# Patient Record
Sex: Female | Born: 1975 | Race: White | Hispanic: No | Marital: Married | Smoking: Former smoker
Health system: Southern US, Community
[De-identification: ages and names within clinical notes are randomized; demographics above are authoritative.]

## PROBLEM LIST (undated history)

## (undated) ENCOUNTER — Inpatient Hospital Stay (HOSPITAL_COMMUNITY): Payer: Self-pay

## (undated) DIAGNOSIS — R51 Headache: Secondary | ICD-10-CM

## (undated) DIAGNOSIS — E119 Type 2 diabetes mellitus without complications: Secondary | ICD-10-CM

## (undated) HISTORY — PX: TONSILLECTOMY: SUR1361

---

## 2001-08-31 ENCOUNTER — Emergency Department (HOSPITAL_COMMUNITY): Admission: EM | Admit: 2001-08-31 | Discharge: 2001-08-31 | Payer: Self-pay | Admitting: Emergency Medicine

## 2001-09-04 ENCOUNTER — Emergency Department (HOSPITAL_COMMUNITY): Admission: EM | Admit: 2001-09-04 | Discharge: 2001-09-04 | Payer: Self-pay | Admitting: Emergency Medicine

## 2001-10-24 ENCOUNTER — Emergency Department (HOSPITAL_COMMUNITY): Admission: EM | Admit: 2001-10-24 | Discharge: 2001-10-24 | Payer: Self-pay | Admitting: Emergency Medicine

## 2002-04-28 ENCOUNTER — Emergency Department (HOSPITAL_COMMUNITY): Admission: EM | Admit: 2002-04-28 | Discharge: 2002-04-29 | Payer: Self-pay | Admitting: Emergency Medicine

## 2002-07-20 ENCOUNTER — Inpatient Hospital Stay (HOSPITAL_COMMUNITY): Admission: AD | Admit: 2002-07-20 | Discharge: 2002-07-20 | Payer: Self-pay | Admitting: *Deleted

## 2002-08-21 ENCOUNTER — Inpatient Hospital Stay (HOSPITAL_COMMUNITY): Admission: EM | Admit: 2002-08-21 | Discharge: 2002-08-22 | Payer: Self-pay | Admitting: Psychiatry

## 2002-12-18 ENCOUNTER — Emergency Department (HOSPITAL_COMMUNITY): Admission: EM | Admit: 2002-12-18 | Discharge: 2002-12-19 | Payer: Self-pay | Admitting: Emergency Medicine

## 2003-03-26 ENCOUNTER — Encounter: Payer: Self-pay | Admitting: Family Medicine

## 2003-03-26 ENCOUNTER — Ambulatory Visit (HOSPITAL_COMMUNITY): Admission: RE | Admit: 2003-03-26 | Discharge: 2003-03-26 | Payer: Self-pay | Admitting: Family Medicine

## 2003-04-29 ENCOUNTER — Emergency Department (HOSPITAL_COMMUNITY): Admission: EM | Admit: 2003-04-29 | Discharge: 2003-04-29 | Payer: Self-pay | Admitting: Emergency Medicine

## 2003-10-10 ENCOUNTER — Emergency Department (HOSPITAL_COMMUNITY): Admission: EM | Admit: 2003-10-10 | Discharge: 2003-10-10 | Payer: Self-pay | Admitting: Emergency Medicine

## 2003-11-04 ENCOUNTER — Emergency Department (HOSPITAL_COMMUNITY): Admission: AD | Admit: 2003-11-04 | Discharge: 2003-11-04 | Payer: Self-pay | Admitting: Family Medicine

## 2003-11-05 ENCOUNTER — Emergency Department (HOSPITAL_COMMUNITY): Admission: AD | Admit: 2003-11-05 | Discharge: 2003-11-05 | Payer: Self-pay | Admitting: Family Medicine

## 2003-12-29 ENCOUNTER — Emergency Department (HOSPITAL_COMMUNITY): Admission: EM | Admit: 2003-12-29 | Discharge: 2003-12-29 | Payer: Self-pay | Admitting: Family Medicine

## 2004-01-22 ENCOUNTER — Emergency Department (HOSPITAL_COMMUNITY): Admission: AD | Admit: 2004-01-22 | Discharge: 2004-01-22 | Payer: Self-pay | Admitting: Family Medicine

## 2004-06-23 ENCOUNTER — Emergency Department (HOSPITAL_COMMUNITY): Admission: EM | Admit: 2004-06-23 | Discharge: 2004-06-23 | Payer: Self-pay | Admitting: Emergency Medicine

## 2004-07-04 ENCOUNTER — Emergency Department (HOSPITAL_COMMUNITY): Admission: EM | Admit: 2004-07-04 | Discharge: 2004-07-04 | Payer: Self-pay | Admitting: Internal Medicine

## 2004-07-11 ENCOUNTER — Emergency Department (HOSPITAL_COMMUNITY): Admission: EM | Admit: 2004-07-11 | Discharge: 2004-07-11 | Payer: Self-pay | Admitting: Podiatry

## 2004-07-22 ENCOUNTER — Ambulatory Visit: Payer: Self-pay | Admitting: Nurse Practitioner

## 2004-08-18 ENCOUNTER — Ambulatory Visit: Payer: Self-pay | Admitting: Nurse Practitioner

## 2004-11-03 ENCOUNTER — Ambulatory Visit: Payer: Self-pay | Admitting: Nurse Practitioner

## 2004-12-31 ENCOUNTER — Emergency Department (HOSPITAL_COMMUNITY): Admission: EM | Admit: 2004-12-31 | Discharge: 2004-12-31 | Payer: Self-pay | Admitting: Emergency Medicine

## 2005-01-06 ENCOUNTER — Ambulatory Visit (HOSPITAL_COMMUNITY): Admission: RE | Admit: 2005-01-06 | Discharge: 2005-01-06 | Payer: Self-pay | Admitting: Pediatrics

## 2005-01-19 ENCOUNTER — Inpatient Hospital Stay (HOSPITAL_COMMUNITY): Admission: AD | Admit: 2005-01-19 | Discharge: 2005-01-19 | Payer: Self-pay | Admitting: *Deleted

## 2005-03-10 ENCOUNTER — Inpatient Hospital Stay (HOSPITAL_COMMUNITY): Admission: AD | Admit: 2005-03-10 | Discharge: 2005-03-10 | Payer: Self-pay | Admitting: Obstetrics and Gynecology

## 2005-03-14 ENCOUNTER — Inpatient Hospital Stay (HOSPITAL_COMMUNITY): Admission: AD | Admit: 2005-03-14 | Discharge: 2005-03-14 | Payer: Self-pay | Admitting: *Deleted

## 2005-03-15 ENCOUNTER — Inpatient Hospital Stay (HOSPITAL_COMMUNITY): Admission: AD | Admit: 2005-03-15 | Discharge: 2005-03-15 | Payer: Self-pay | Admitting: *Deleted

## 2005-03-16 ENCOUNTER — Inpatient Hospital Stay (HOSPITAL_COMMUNITY): Admission: AD | Admit: 2005-03-16 | Discharge: 2005-03-16 | Payer: Self-pay | Admitting: *Deleted

## 2005-04-27 ENCOUNTER — Ambulatory Visit (HOSPITAL_COMMUNITY): Admission: RE | Admit: 2005-04-27 | Discharge: 2005-04-27 | Payer: Self-pay | Admitting: *Deleted

## 2005-07-08 ENCOUNTER — Ambulatory Visit (HOSPITAL_COMMUNITY): Admission: RE | Admit: 2005-07-08 | Discharge: 2005-07-08 | Payer: Self-pay | Admitting: Family Medicine

## 2005-07-27 ENCOUNTER — Inpatient Hospital Stay (HOSPITAL_COMMUNITY): Admission: AD | Admit: 2005-07-27 | Discharge: 2005-07-27 | Payer: Self-pay | Admitting: Obstetrics & Gynecology

## 2005-07-30 ENCOUNTER — Inpatient Hospital Stay (HOSPITAL_COMMUNITY): Admission: AD | Admit: 2005-07-30 | Discharge: 2005-07-31 | Payer: Self-pay | Admitting: Obstetrics & Gynecology

## 2005-08-07 ENCOUNTER — Inpatient Hospital Stay (HOSPITAL_COMMUNITY): Admission: AD | Admit: 2005-08-07 | Discharge: 2005-08-07 | Payer: Self-pay | Admitting: *Deleted

## 2005-08-07 ENCOUNTER — Ambulatory Visit: Payer: Self-pay | Admitting: Certified Nurse Midwife

## 2005-08-16 ENCOUNTER — Ambulatory Visit (HOSPITAL_BASED_OUTPATIENT_CLINIC_OR_DEPARTMENT_OTHER): Admission: RE | Admit: 2005-08-16 | Discharge: 2005-08-16 | Payer: Self-pay | Admitting: *Deleted

## 2005-08-16 ENCOUNTER — Inpatient Hospital Stay (HOSPITAL_COMMUNITY): Admission: AD | Admit: 2005-08-16 | Discharge: 2005-08-16 | Payer: Self-pay | Admitting: Obstetrics and Gynecology

## 2005-08-17 ENCOUNTER — Ambulatory Visit (HOSPITAL_BASED_OUTPATIENT_CLINIC_OR_DEPARTMENT_OTHER): Admission: RE | Admit: 2005-08-17 | Discharge: 2005-08-17 | Payer: Self-pay | Admitting: *Deleted

## 2005-08-22 ENCOUNTER — Ambulatory Visit: Payer: Self-pay | Admitting: Internal Medicine

## 2005-08-25 ENCOUNTER — Ambulatory Visit: Payer: Self-pay | Admitting: Pulmonary Disease

## 2005-08-31 ENCOUNTER — Ambulatory Visit: Payer: Self-pay | Admitting: Certified Nurse Midwife

## 2005-08-31 ENCOUNTER — Inpatient Hospital Stay (HOSPITAL_COMMUNITY): Admission: AD | Admit: 2005-08-31 | Discharge: 2005-08-31 | Payer: Self-pay | Admitting: *Deleted

## 2005-09-03 ENCOUNTER — Ambulatory Visit: Payer: Self-pay | Admitting: *Deleted

## 2005-09-06 ENCOUNTER — Inpatient Hospital Stay (HOSPITAL_COMMUNITY): Admission: AD | Admit: 2005-09-06 | Discharge: 2005-09-10 | Payer: Self-pay | Admitting: Obstetrics & Gynecology

## 2005-09-06 ENCOUNTER — Encounter (INDEPENDENT_AMBULATORY_CARE_PROVIDER_SITE_OTHER): Payer: Self-pay | Admitting: *Deleted

## 2005-09-06 ENCOUNTER — Ambulatory Visit: Payer: Self-pay | Admitting: *Deleted

## 2005-09-16 ENCOUNTER — Ambulatory Visit: Payer: Self-pay | Admitting: Pulmonary Disease

## 2005-09-23 ENCOUNTER — Ambulatory Visit: Payer: Self-pay | Admitting: Family Medicine

## 2005-10-11 ENCOUNTER — Emergency Department (HOSPITAL_COMMUNITY): Admission: EM | Admit: 2005-10-11 | Discharge: 2005-10-11 | Payer: Self-pay | Admitting: Family Medicine

## 2005-10-27 ENCOUNTER — Emergency Department (HOSPITAL_COMMUNITY): Admission: EM | Admit: 2005-10-27 | Discharge: 2005-10-27 | Payer: Self-pay | Admitting: Emergency Medicine

## 2005-11-15 ENCOUNTER — Emergency Department (HOSPITAL_COMMUNITY): Admission: EM | Admit: 2005-11-15 | Discharge: 2005-11-15 | Payer: Self-pay | Admitting: Emergency Medicine

## 2005-12-13 ENCOUNTER — Ambulatory Visit: Payer: Self-pay | Admitting: Nurse Practitioner

## 2006-01-03 ENCOUNTER — Encounter: Admission: RE | Admit: 2006-01-03 | Discharge: 2006-01-03 | Payer: Self-pay | Admitting: Family Medicine

## 2006-01-18 ENCOUNTER — Ambulatory Visit: Payer: Self-pay | Admitting: Pulmonary Disease

## 2006-02-10 ENCOUNTER — Ambulatory Visit: Payer: Self-pay | Admitting: *Deleted

## 2006-02-10 ENCOUNTER — Ambulatory Visit: Payer: Self-pay | Admitting: Nurse Practitioner

## 2006-02-25 ENCOUNTER — Ambulatory Visit: Payer: Self-pay | Admitting: Nurse Practitioner

## 2006-05-12 ENCOUNTER — Ambulatory Visit: Payer: Self-pay | Admitting: Nurse Practitioner

## 2006-06-07 ENCOUNTER — Ambulatory Visit: Payer: Self-pay | Admitting: Nurse Practitioner

## 2006-08-03 ENCOUNTER — Ambulatory Visit: Payer: Self-pay | Admitting: Nurse Practitioner

## 2006-10-19 ENCOUNTER — Ambulatory Visit: Payer: Self-pay | Admitting: Pulmonary Disease

## 2007-01-30 ENCOUNTER — Inpatient Hospital Stay (HOSPITAL_COMMUNITY): Admission: AD | Admit: 2007-01-30 | Discharge: 2007-01-30 | Payer: Self-pay | Admitting: Obstetrics & Gynecology

## 2007-02-01 ENCOUNTER — Other Ambulatory Visit: Admission: RE | Admit: 2007-02-01 | Discharge: 2007-02-01 | Payer: Self-pay | Admitting: Obstetrics and Gynecology

## 2007-03-02 ENCOUNTER — Ambulatory Visit (HOSPITAL_COMMUNITY): Admission: RE | Admit: 2007-03-02 | Discharge: 2007-03-02 | Payer: Self-pay | Admitting: Obstetrics and Gynecology

## 2007-05-08 ENCOUNTER — Inpatient Hospital Stay (HOSPITAL_COMMUNITY): Admission: AD | Admit: 2007-05-08 | Discharge: 2007-05-08 | Payer: Self-pay | Admitting: Obstetrics and Gynecology

## 2007-06-15 ENCOUNTER — Ambulatory Visit: Payer: Self-pay | Admitting: Pulmonary Disease

## 2007-06-27 ENCOUNTER — Encounter: Payer: Self-pay | Admitting: Nurse Practitioner

## 2007-06-27 DIAGNOSIS — Z87898 Personal history of other specified conditions: Secondary | ICD-10-CM | POA: Insufficient documentation

## 2007-06-27 DIAGNOSIS — F410 Panic disorder [episodic paroxysmal anxiety] without agoraphobia: Secondary | ICD-10-CM | POA: Insufficient documentation

## 2007-07-08 ENCOUNTER — Inpatient Hospital Stay (HOSPITAL_COMMUNITY): Admission: AD | Admit: 2007-07-08 | Discharge: 2007-07-08 | Payer: Self-pay | Admitting: Obstetrics and Gynecology

## 2007-07-19 ENCOUNTER — Encounter (INDEPENDENT_AMBULATORY_CARE_PROVIDER_SITE_OTHER): Payer: Self-pay | Admitting: Obstetrics and Gynecology

## 2007-07-19 ENCOUNTER — Inpatient Hospital Stay (HOSPITAL_COMMUNITY): Admission: RE | Admit: 2007-07-19 | Discharge: 2007-07-22 | Payer: Self-pay | Admitting: Obstetrics and Gynecology

## 2007-08-31 ENCOUNTER — Other Ambulatory Visit: Admission: RE | Admit: 2007-08-31 | Discharge: 2007-08-31 | Payer: Self-pay | Admitting: Obstetrics and Gynecology

## 2007-10-10 ENCOUNTER — Ambulatory Visit: Payer: Self-pay | Admitting: Internal Medicine

## 2008-03-15 ENCOUNTER — Ambulatory Visit: Payer: Self-pay | Admitting: Internal Medicine

## 2008-08-08 ENCOUNTER — Ambulatory Visit: Payer: Self-pay | Admitting: Internal Medicine

## 2008-08-08 LAB — CONVERTED CEMR LAB
BUN: 20 mg/dL (ref 6–23)
CO2: 23 meq/L (ref 19–32)
Calcium: 9.4 mg/dL (ref 8.4–10.5)
Chloride: 109 meq/L (ref 96–112)
Cholesterol: 164 mg/dL (ref 0–200)
Creatinine, Ser: 0.83 mg/dL (ref 0.40–1.20)
Glucose, Bld: 92 mg/dL (ref 70–99)
HDL: 58 mg/dL (ref >39)
LDL Cholesterol: 89 mg/dL (ref 0–99)
Potassium: 4.2 meq/L (ref 3.5–5.3)
Preg, Serum: NEGATIVE
Sodium: 142 meq/L (ref 135–145)
TSH: 0.654 microintl units/mL (ref 0.350–4.50)
Total CHOL/HDL Ratio: 2.8
Triglycerides: 84 mg/dL (ref <150)
VLDL: 17 mg/dL (ref 0–40)

## 2008-09-18 ENCOUNTER — Emergency Department (HOSPITAL_COMMUNITY): Admission: EM | Admit: 2008-09-18 | Discharge: 2008-09-18 | Payer: Self-pay | Admitting: Emergency Medicine

## 2008-10-06 ENCOUNTER — Emergency Department (HOSPITAL_COMMUNITY): Admission: EM | Admit: 2008-10-06 | Discharge: 2008-10-06 | Payer: Self-pay | Admitting: Emergency Medicine

## 2009-02-05 ENCOUNTER — Emergency Department (HOSPITAL_COMMUNITY): Admission: EM | Admit: 2009-02-05 | Discharge: 2009-02-05 | Payer: Self-pay | Admitting: Emergency Medicine

## 2009-02-19 ENCOUNTER — Ambulatory Visit: Payer: Self-pay | Admitting: Internal Medicine

## 2009-08-10 ENCOUNTER — Emergency Department (HOSPITAL_COMMUNITY): Admission: EM | Admit: 2009-08-10 | Discharge: 2009-08-10 | Payer: Self-pay | Admitting: Emergency Medicine

## 2010-01-06 ENCOUNTER — Emergency Department (HOSPITAL_COMMUNITY): Admission: EM | Admit: 2010-01-06 | Discharge: 2010-01-06 | Payer: Self-pay | Admitting: Family Medicine

## 2010-02-06 ENCOUNTER — Ambulatory Visit: Payer: Self-pay | Admitting: Internal Medicine

## 2010-08-10 ENCOUNTER — Emergency Department (HOSPITAL_COMMUNITY): Admission: EM | Admit: 2010-08-10 | Discharge: 2010-08-10 | Payer: Self-pay | Admitting: Emergency Medicine

## 2010-11-29 ENCOUNTER — Encounter: Payer: Self-pay | Admitting: Family Medicine

## 2010-11-29 ENCOUNTER — Encounter: Payer: Self-pay | Admitting: Obstetrics and Gynecology

## 2011-02-11 LAB — URINALYSIS, ROUTINE W REFLEX MICROSCOPIC
Bilirubin Urine: NEGATIVE
Glucose, UA: NEGATIVE mg/dL
Ketones, ur: NEGATIVE mg/dL
Leukocytes, UA: NEGATIVE
Nitrite: NEGATIVE
Protein, ur: 30 mg/dL — AB
Specific Gravity, Urine: 1.016 (ref 1.005–1.030)
Urobilinogen, UA: 0.2 mg/dL (ref 0.0–1.0)
pH: 5 (ref 5.0–8.0)

## 2011-02-11 LAB — BASIC METABOLIC PANEL
BUN: 7 mg/dL (ref 6–23)
CO2: 26 mEq/L (ref 19–32)
Calcium: 9.6 mg/dL (ref 8.4–10.5)
Chloride: 105 mEq/L (ref 96–112)
Creatinine, Ser: 0.58 mg/dL (ref 0.4–1.2)
GFR calc Af Amer: >60 mL/min (ref >60)
GFR calc non Af Amer: >60 mL/min (ref >60)
Glucose, Bld: 106 mg/dL — ABNORMAL HIGH (ref 70–99)
Potassium: 3.5 mEq/L (ref 3.5–5.1)
Sodium: 139 mEq/L (ref 135–145)

## 2011-02-11 LAB — URINE MICROSCOPIC-ADD ON

## 2011-02-11 LAB — PREGNANCY, URINE: Preg Test, Ur: NEGATIVE

## 2011-03-23 NOTE — Op Note (Signed)
[name], [name]               ACCOUNT NO.:  [id number]   MEDICAL RECORD NO.:  [id number]          PATIENT TYPE:  INP   LOCATION:  9110                          FACILITY:  [facility]   PHYSICIAN:  Rudy Jew. Ashley Royalty, M.D.DATE OF BIRTH:  Jul 27, 1976   DATE OF PROCEDURE:  07/19/2007  DATE OF DISCHARGE:                               OPERATIVE REPORT   PREOPERATIVE DIAGNOSES:  1. Intrauterine pregnancy at [redacted] weeks gestation.  2. Previous cesarean section.   POSTOPERATIVE DIAGNOSES:  1. Intrauterine pregnancy at [redacted] weeks gestation.  2. Previous cesarean section.   PROCEDURE:  Repeat low transverse cesarean section.   SURGEON:  Sylvester Harder, M.D.   ANESTHESIA:  Spinal.   FINDINGS:  A 7 pound 2 ounce female, Apgar's 8 at 1 minute and 9 at 5  minutes sent to newborn nursery.   ESTIMATED BLOOD LOSS:  600 cc.   COMPLICATIONS:  None.   PACKS AND DRAINS:  Foley.   SPONGE/NEEDLE/INSTRUMENT COUNT:  Reported as correct x2.   PROCEDURE:  The patient was taken to the operating room and placed in  the sitting position.  After a spinal anesthetic was administered, she  was placed in the dorsal supine position, prepped and draped in the  usual manner for abdominal surgery.  Foley catheter was placed.  The  Pfannenstiel incision was made through the patient's old skin incision.  The subcutaneous tissues were sharply dissected down to the fascia which  was nicked with a knife and incised transversely with Mayo scissors.  The underlying rectus muscles were separated from the fascia using sharp  and blunt dissection.  The rectus muscles were separated in the midline  exposing the peritoneum which was opened with hemostats and entered  atraumatically with Metzenbaum scissors.  The incision was extended  longitudinally.  Uterus was identified and a bladder flap created by  incising intrauterine serosa and sharply and bluntly dissecting the  bladder inferiorly.  It was held in place with a bladder  blade.  The  uterus was then entered through a low transverse incision using sharp  and blunt dissection.  The fluid was noted to be clear.  The infant was  delivered from the vertex presentation with the aid of the vacuum  extractor.  The infant was suctioned.  Cord was doubly clamped, cut and  the infant given immediately to the awaiting pediatrics team with Dr.  Ruben Gottron in attendance.  Cord blood was obtained and placenta and  membranes removed in their entirety.  The uterus was then closed in two  running layers with #1 Vicryl.  The first was a running locking layer.  The second was a running, intermittently locking, and imbricating layer.  One additional figure-of-eight suture was required to obtain hemostasis.  Hemostasis was noted.  Uterus, tubes and ovaries were inspected and  found to be normal.  Copious irrigation was accomplished and hemostasis  noted.  The peritoneum was then closed with 3-0 Vicryl in a running  fashion.  The fascia was closed with 0 Vicryl in a running fashion.  The  skin was closed with staples.  The patient tolerated procedure extremely well and was returned to the  recovery room in good condition.  At the conclusion of the procedure,  the urine was clear and copious.      James A. Ashley Royalty, M.D.  Electronically Signed     JAM/MEDQ  D:  07/19/2007  T:  07/20/2007  Job:  [id number]

## 2011-03-26 NOTE — Discharge Summary (Signed)
[name], [name]                         ACCOUNT NO.:  [id number]   MEDICAL RECORD NO.:  [id number]                   PATIENT TYPE:  IPS   LOCATION:  0504                                 [facility]ILITY[facility]   PHYSICIAN:  Jeanice Lim, M.D.              DATE OF BIRTH:  1976-02-28   DATE OF ADMISSION:  08/21/2002  DATE OF DISCHARGE:  08/22/2002                                 DISCHARGE SUMMARY   IDENTIFYING DATA:  This is a 35 year old Caucasian female, married,  voluntarily admitted, with a history of worsening depression, uncontrolled  crying, fear that she might commit suicide or hurt herself again.  Has been  irritable with children and unable to sleep and chronic conflict with  husband.   ADMISSION MEDICATIONS:  Lexapro, Ambien, Xanax.   ALLERGIES:  TORADOL.   PHYSICAL EXAMINATION:  Essentially within normal limits, neurologically  nonfocal.   ROUTINE ADMISSION LABS:  Essentially within normal limits.  CBC and CMET  within normal limits including liver function tests and thyroid panel.  TSH  was 1.164.  Urine tox screen was positive for benzodiazapines, positive for  barbiturates, positive for opiates, and otherwise negative.   MENTAL STATUS EXAM:  Well-nourished, well-developed female, fully alert,  blunted affect, somewhat withdrawn.  Speech within normal limits.  Mood  depressed, thought process goal directed, thought content negative for  psychotic symptoms, positive for vague suicidal ideation with thoughts of  cutting herself.  Judgment and insight poor.   ADMISSION DIAGNOSES:   AXIS I:  1. Major depression, recurrent, severe.  2. Caffeine abuse.   AXIS II:  None.   AXIS III:  History of migraines and episodic pain.   AXIS IV:  Moderate, marital conflict.   AXIS V:  38/54.   HOSPITAL COURSE:  The patient was admitted and ordered routine p.r.n.  medications, underwent further monitoring, and was encouraged to participate  in individual, group and  milieu therapy.  The patient was treated with  Fioricet regarding headaches and restarted on Lexapro, trazodone and Xanax  q.6 p.r.n.  Family meeting was held and patient was given Zomig for  continued headache which was ineffective, and the patient was then given  Demerol and Phenergan with a positive response.  The patient reported  significant improvement with clinical intervention.   CONDITION ON DISCHARGE:  Markedly improved.  Mood was more euthymic, affect  brighter, thought process goal directed.  Thought content negative for  dangerous ideation or psychotic symptoms.  The patient reported motivation  to be compliant with followup plan.   DISCHARGE MEDICATIONS:  1. Lexapro 10 mg 2 q.a.m.  2. Trazodone 50 mg 2 q.h.s. p.r.n. insomnia.  3. Ambien 10 mg p.o. q.h.s. p.r.n. insomnia.  4. Xanax 0.25 mg q.6 p.r.n. anxiety.   DISPOSITION:  The patient was to follow up with Dr. Lourdes Sledge on October 23  at 11 a.m. and with the Intensive outpatient program.  Jeanice Lim, M.D.    JEM/MEDQ  D:  09/19/2002  T:  09/19/2002  Job:  [id number]

## 2011-03-26 NOTE — Discharge Summary (Signed)
[name], [name]               ACCOUNT NO.:  [id number]   MEDICAL RECORD NO.:  [id number]          PATIENT TYPE:  INP   LOCATION:  9110                          [facility]ILITY[facility]   PHYSICIAN:  Rudy Jew. Ashley Royalty, M.D.DATE OF BIRTH:  Jun 19, 1976   DATE OF ADMISSION:  07/19/2007  DATE OF DISCHARGE:  07/22/2007                               DISCHARGE SUMMARY   DISCHARGE DIAGNOSES:  1. Intrauterine pregnancy at [redacted] weeks gestation, delivered.  2. Previous cesarean section.  3. Cervical intraepithelial neoplasia, grade 3.  4. Rh negative.   OPERATIONS AND SPECIAL PROCEDURES:  Repeat low transverse cesarean  section.   CONSULTATIONS:  None.   DISCHARGE MEDICATIONS:  Percocet, Motrin 600 mg.   HISTORY AND PHYSICAL:  This is 30-year gravida 3, para 2, at 31 weeks'  gestation.  Prenatal care was complicated by the aforementioned  diagnoses.  The patient did receive RhoGAM at 28 weeks' gestation.  She  was admitted for a repeat cesarean section.  For the remainder of the  history and physical, please see chart.   HOSPITAL COURSE:  The patient was admitted to [facility].  Admission laboratory studies were drawn.  On July 19, 2007, she was taken to the operating room and underwent a repeat low  transverse cesarean section per Dr. Sylvester Harder.  The procedure  yielded a 7 pound 2 ounce female, Apgars 8 at one minute and 9 at five  minutes, sent to the newborn nursery.  There were no complications.  The  patient was then observed postoperatively.  She was felt to be stable  for discharge on July 22, 2007.  She was discharged home afebrile  and in satisfactory condition on that date.   DISPOSITION:  The patient is to return to [facility] in approximately 6 weeks for postpartum evaluation.      James A. Ashley Royalty, M.D.  Electronically Signed     JAM/MEDQ  D:  09/12/2007  T:  09/13/2007  Job:  [id number]

## 2011-03-26 NOTE — Procedures (Signed)
NAMEJAYLINE, [name]               ACCOUNT NO.:  [id number]   MEDICAL RECORD NO.:  [id number]          PATIENT TYPE:  OUT   LOCATION:  SLEEP CENTER                 FACILITY:  [facility]   PHYSICIAN:  Clinton D. Maple Hudson, M.D. DATE OF BIRTH:  06-Apr-1976   DATE OF STUDY:  08/17/2005                              NOCTURNAL POLYSOMNOGRAM   REFERRING PHYSICIAN:  Conni Elliot, M.D.   DATE OF STUDY:  August 17, 2005   INDICATION FOR STUDY:  Hypersomnia with sleep apnea.  The patient is [redacted]  weeks pregnant.  There is prior history of seizure disorder.   SLEEP ARCHITECTURE:  Total sleep time 238 minutes.  Sleep efficiency 58%.  Stage I was 12%, Stage II 85%, Stages III and IV were absent.  REM 3% of  total sleep. Sleep latency 5 minutes, REM latency 69 minutes.  Awake after  sleep onset 165 minutes.  Arousal index increased at 112.  She had taken 10  mg Ambien at 10:26 p.m.   RESPIRATORY DATA:  Respiratory disturbance index (AHI, RDI) 116 obstructive  events per hour, indicating very severe obstructive sleep apnea/hypopnea  syndrome.  There were 1 central apnea, 386 obstructive apneas, and 76  hypopneas. Events were not positional. REM AHI 30 per hour. She was unable  to sustain sleep sufficiently for CPAP titration by split protocol on this  study night with time awake between midnight and 1:30 a.m. and again 3:30  through 4:30 a.m.   OXYGEN DATA:  Loud snoring with oxygen desaturation to a nadir of 79%.  Mean  oxygen saturation through the study was 93% on room air.   CARDIAC DATA:  Sinus rhythm with PACs, sinus arrhythmia, and sinus  tachycardia with peak heart rate recorded 149 and lowest heart rate 42 beats  per minute.   MOVEMENT-PARASOMNIA:  Occasional leg jerk, insignificant.  Bathroom x3. She  had given a history of previous seizure. Additional EEG leads were applied,  but seizure activity was not noted on this study.   IMPRESSIONS-RECOMMENDATIONS:  1.  Very severe obstructive  sleep apnea/hypopnea syndrome, AHI 116 per      minute with loud snoring and oxygen desaturation to a nadir of 79%.  2.  Difficulty initiating and maintaining sleep despite Ambien 10 mg at      bedtime.  3.  Consider early return for CPAP titration or evaluate for alternative      therapies as appropriate. Some degree of improvement can be anticipated      after delivery.      Clinton D. Maple Hudson, M.D.  Diplomate, Biomedical engineer of Sleep Medicine  Electronically Signed     CDY/MEDQ  D:  08/18/2005 14:25:22  T:  08/18/2005 15:26:39  Job:  [id number]

## 2011-03-26 NOTE — Op Note (Signed)
[name], [name]               ACCOUNT NO.:  [id number]   MEDICAL RECORD NO.:  [id number]          PATIENT TYPE:  INP   LOCATION:  9111                          FACILITY:  [facility]   PHYSICIAN:  Conni Elliot, M.D.DATE OF BIRTH:  1976-07-31   DATE OF PROCEDURE:  09/07/2005  DATE OF DISCHARGE:                                 OPERATIVE REPORT   PREOPERATIVE DIAGNOSES:  1.  Forty-one week intrauterine pregnancy.  2.  Persistent bradycardia.   POSTOPERATIVE DIAGNOSES:  1.  Forty-one week intrauterine pregnancy.  2.  Persistent bradycardia.   PROCEDURE:  Primary low transverse cesarean section via Pfannenstiel.   SURGEON:  Conni Elliot, M.D.   ASSISTANT:  Marc Morgans. Mayford Knife, M.D.   ANESTHESIA:  Epidural.   COMPLICATIONS:  None.   ESTIMATED BLOOD LOSS:  800 mL.   URINE OUTPUT:  100 mL clear urine.   FLUIDS:  2200 mL lactated Ringer's.   INDICATIONS:  The patient is a 35 year old G2, P1, at 40-6/7, who was  admitted last night for Cervidil induction.  The patient progressed to 8 cm  by an R.N. exam.  She did have one brief episode of bradycardia.  The  Cervidil was pulled and she got an IV fluid bolus, oxygen and was rolled.  She responded well to that.  She then had artificial rupture of membranes  plus a fetal scalp electrode.  There was clear fluid.  The patient went on  to develop persistent bradycardia that did not respond to resuscitation,  including IV fluid bolus, rolling her, oxygen and terbutaline.  The patient  was taken to the back.  Please note that while this was going on, the  patient had had an epidural approximately an hour and a half earlier and her  blood pressure was 110/56 while the baby was bradycardic.  In the OR, Dr.  Penne Lash examined the patient and found her to still be 7 cm and a decision  was made to proceed with a cesarean section.   FINDINGS:  A female infant in the occiput posterior cephalic presentation.  Apgars 8 and 9.  Weight 8 pounds  4 ounces.  Normal uterus, tubes, and  ovaries.  Blood gas was 7.22.   PROCEDURE:  The patient was taken to the operating room, where anesthesia  was found to be adequate.  She was then prepped and draped in the normal  sterile fashion in the dorsal supine position with a leftward tilt.  A  Pfannenstiel skin incision was then made with a scalpel and carried through  to the underlying layer of fascia.  The fascia was nicked in the midline and  the incision extended bilaterally with the Mayo scissors.  The superior  aspect of the fascial incision was then grasped with Kocher clamps, elevated  and the underlying rectus muscles dissected off bluntly and using Mayos.  Attention was then turned to the inferior aspect of the incision, which in a  similar fashion was grasped, tented up with Kocher clamps, and the rectus  muscles dissected off bluntly.  The rectus muscles were then separated in  the midline and peritoneum identified, tented up and entered sharply with  the Metzenbaum scissors.  The peritoneal incision was then extended  superiorly and inferiorly with good visualization of the bladder.   The bladder blade was then inserted and the vesicouterine peritoneum  identified, grasped with the pick-ups, and entered sharply with the  Metzenbaum scissors.  The incision was extended laterally and a bladder flap  created digitally.   The bladder blade was then reinserted and the lower uterine segment incised  in a transverse fashion with a scalpel.  The uterine incision was then  extended laterally manually.  The bladder blade was removed and the infant's  head and body delivered atraumatically.  The nose and mouth were bulb-  suctioned and the cord clamped and cut.  The infant was handed off to the  awaiting neonatologist.  Cord gases were sent.  The placenta was then  removed by massaging the outside of the uterus.  The uterus was exteriorized  and cleared of all clots and debris.  The  uterine incision was then repaired  with 1-0 chromic in a running locked fashion and a second layer of the same  suture was used to imbricate.  There was excellent hemostasis.  The uterus  was then replaced and the gutter was irrigated.   The bladder flap was repaired with 2-0 chromic on an SH in a running stitch.  Gutters were cleared of all clots and the peritoneum closed with 2-0  chromic.  The fascia was reapproximated with 0 Vicryl in a running fashion.  The subcutaneous tissue was closed with plain gut.  The skin was closed with  staples.   The patient tolerated the procedure well.  Sponge, lap and instrument counts  were correct x2.  Ancef 1 g was given at cord clamp.  The patient was taken  to the recovery room in stable condition, and there were no complications.     ______________________________  Marc Morgans Mayford Knife, M.D.    ______________________________  Conni Elliot, M.D.    TLW/MEDQ  D:  09/07/2005  T:  09/07/2005  Job:  [id number]

## 2011-03-26 NOTE — Procedures (Signed)
NAMECHARLIZE, [name]               ACCOUNT NO.:  [id number]   MEDICAL RECORD NO.:  [id number]          PATIENT TYPE:  OUT   LOCATION:  SLEEP CENTER                 FACILITY:  [facility]   PHYSICIAN:  Clinton D. Maple Hudson, M.D. DATE OF BIRTH:  February 05, 1976   DATE OF STUDY:  08/17/2005                              NOCTURNAL POLYSOMNOGRAM   INDICATION FOR STUDY:  Hypersomnia with sleep apnea.   EPWORTH SLEEPINESS SCORE:  12/24; BMI 29; weight 154 pounds. A baseline  diagnostic NPSG was done August 16, 2006 recording an AHI of 116 per hour.  CPAP titration is now requested. The patient is [redacted] weeks pregnant.   SLEEP ARCHITECTURE:  Short total sleep time 217 minutes with sleep  efficiency 56%. Stage 1 was 8%, stage 2 62%, stages 3 and 4 23%, REM 75 of  total sleep time. Sleep latency 13 minutes, REM latency 193 minutes, awake  after sleep onset 157 minutes, arousal index 58. The patient had taken  Ambien at bedtime.   RESPIRATORY DATA:  1.  CPAP titration protocol. CPAP was titrated to 15 CWP, AHI 0 per hour. A      large Respironics Comfort Lite 2 nasal mask was used with chin strap and      heated humidifier.  2.  Baseline diagnostic NPSG on August 16, 2005 had recorded an AHI of 116      obstructive events per hour.  3.  The patient had difficulty maintaining sleep near the end of the study      due to hip pain and back pain attributed to pregnancy.   OXYGEN DATA:  Therapeutic CPAP levels prevented audible snoring and  maintained oxygen saturation 95-98% on room air.   CARDIAC DATA:  Sinus rhythm with frequent PACs.   MOVEMENT-PARASOMNIA:  Occasional leg jerk with little effect on sleep.  Bathroom x2.   IMPRESSIONS-RECOMMENDATIONS:  1.  Successful CPAP titration to 15 CWP, AHI 0 per hour. A large Respironics      Comfort Lite 2 nasal mask was used with heated humidifier. The      technician also provided a chin strip for this study, unlikely to be      needed at home.  2.  Baseline  diagnostic NPSG on August 16, 2005 had recorded an AHI of 116      per hour.  3.  This information is being communicated to the office of Dr. Gavin Potters to      facilitate early arrangement for home CPAP. Alternative therapies and      adjustments can be provided on follow-up if needed.      Clinton D. Maple Hudson, M.D.  Diplomate, Biomedical engineer of Sleep Medicine  Electronically Signed     CDY/MEDQ  D:  08/18/2005 14:40:44  T:  08/18/2005 15:41:36  Job:  [id number]

## 2011-03-26 NOTE — Procedures (Signed)
DATE:  01/06/2005   REFERRED BY:  Noberto Retort, M.D. and Deanna Artis. Sharene Skeans, M.D.   CLINICAL COURSE:  A 35 year old lady being evaluated for seizures.   MEDICATIONS:  Not listed.   This 17 channel EEG performed in wakeful and drowsy state using standard  10/20 electrode placement.   Background awake rhythm consists of 11-12 Hz alpha which is of good  amplitude, well modulate, synchronous and reactive to eye opening and  closure.  No paroxysmal epileptiform activities, spikes or sharp waves are  seen. Sleep state is not achieved in this tracing except for mild drowsiness  which is uneventful.  Length of the tracing is 24.7 minutes, technical  component is excellent. EKG tracing reveals regular sinus rhythm.  Hyperventilation results no significant abnormalities, photic stimulation is  unremarkable.   IMPRESSION:  Procedure performed during wakeful state and drowsiness is  within normal limits. No definite epileptiform activity was identified. If  seizures are strongly suspected, __________ sleep deprived may be  beneficial.      ZOX:WRUE  D:  01/06/2005 18:50:17  T:  01/07/2005 07:55:31  Job #:  [id number]   cc:   Melida Quitter, M.D.  [address]  [place]  [place] [address]  Fax: [phone]

## 2011-03-26 NOTE — Discharge Summary (Signed)
[name], [name]               ACCOUNT NO.:  [id number]   MEDICAL RECORD NO.:  [id number]          PATIENT TYPE:  INP   LOCATION:  9111                          [facility]ILITY[facility]   PHYSICIAN:  Tracy L. Mayford Knife, M.D.DATE OF BIRTH:  04-Jun-1976   DATE OF ADMISSION:  09/06/2005  DATE OF DISCHARGE:  09/10/2005                                 DISCHARGE SUMMARY   REASON FOR ADMISSION:  Cervidil induction of 40 weeks 6/7-day pregnancy.   DISCHARGE DIAGNOSES:  Status post primary low transverse cesarean section  via Pfannenstiel of 41-week intrauterine pregnancy for persistent  bradycardia.   HOSPITAL COURSE:  The patient is a 35 year old G2, P1 at 40-6/7 who was  admitted for Cervidil induction.  The patient progressed to 8 cm by R.N.  examination the following morning.  She had one brief episode of  bradycardia.  The Cervidil was pulled and she had an IV fluid bolus.  Oxygen  was begun.  She responded well to that.  She then had artificial rupture of  membranes plus a fetal scalp electrode.  There was clear fluid.  The patient  went on to develop a persistent bradycardia that did not respond to  resuscitation which included IV fluid bolus, oxygen and terbutaline.  The  patient was taken to the back.  Please note that while this was going on the  patient had had an epidural approximately an hour and a half earlier to the  persistent decel and at the time of the decel her blood pressure was 110/56.  In the OR Dr. Penne Lash examined the patient, found her still to be 7 cm and  the decision was made to proceed with a cesarean section.  Please see op  note for full details.  The female infant was found to be in the occiput  posterior cephalic presentation.  Apgars were 8 and 9.  Weight was 8 pounds  4 ounces.  Blood gas was 7.22.  Placenta was sent and the final diagnosis  was a mature placenta with three vessel umbilical cord and no significant  inflammation.  Patient tolerated the procedure well  and there were no  complications.  Patient did well postoperatively and was discharged on  postpartum day three.  She was found to be a little bit tachycardic so a TSH  and free T4 was done which were within normal limits.   DISPOSITION:  Home.   FOLLOW-UP:  Two and six weeks.  Regular activity.  No heavy lifting.  Nothing per vagina x6 weeks.   MEDICATIONS:  1.  Depo Provera.  2.  Percocet 5 one to two p.o. q.4-6h.  3.  Ibuprofen 600 mg p.o. q.6h. p.r.n.  4.  Prenatal vitamins one tablet daily.           ______________________________  Marc Morgans Mayford Knife, M.D.     TLW/MEDQ  D:  10/05/2005  T:  10/05/2005  Job:  [id number]

## 2011-08-10 LAB — URINALYSIS, ROUTINE W REFLEX MICROSCOPIC
Glucose, UA: NEGATIVE
Hgb urine dipstick: NEGATIVE
Leukocytes, UA: NEGATIVE
pH: 5.5

## 2011-08-10 LAB — COMPREHENSIVE METABOLIC PANEL
ALT: 24
AST: 29
CO2: 24
Calcium: 8.6
Creatinine, Ser: 0.7
GFR calc Af Amer: >60
GFR calc non Af Amer: >60
Glucose, Bld: 97
Sodium: 137
Total Protein: 5.9 — ABNORMAL LOW

## 2011-08-10 LAB — DIFFERENTIAL
Lymphocytes Relative: 20
Lymphs Abs: 1.1
Monocytes Relative: 4
Neutrophils Relative %: 74

## 2011-08-10 LAB — URINE MICROSCOPIC-ADD ON

## 2011-08-10 LAB — CBC
MCHC: 34.1
MCV: 91.9
RBC: 3.7 — ABNORMAL LOW
RDW: 13.2

## 2011-08-20 LAB — CBC
HCT: 32 — ABNORMAL LOW
MCHC: 35.1
MCV: 88.2
Platelets: 214
Platelets: 243
RDW: 12.9
RDW: 13.1
WBC: 12.2 — ABNORMAL HIGH

## 2011-08-20 LAB — RPR: RPR Ser Ql: NONREACTIVE

## 2011-08-20 LAB — RH IMMUNE GLOB WKUP(>/=20WKS)(NOT WOMEN'S HOSP)

## 2011-08-25 LAB — RH IMMUNE GLOBULIN WORKUP (NOT WOMEN'S HOSP): ABO/RH(D): A NEG

## 2013-01-04 ENCOUNTER — Inpatient Hospital Stay (HOSPITAL_COMMUNITY)
Admission: AD | Admit: 2013-01-04 | Discharge: 2013-01-05 | Disposition: A | Discharge status: Home / Self Care | Payer: Self-pay | Source: Ambulatory Visit | Attending: Obstetrics & Gynecology | Admitting: Obstetrics & Gynecology

## 2013-01-04 ENCOUNTER — Encounter (HOSPITAL_COMMUNITY): Payer: Self-pay | Admitting: *Deleted

## 2013-01-04 DIAGNOSIS — O210 Mild hyperemesis gravidarum: Secondary | ICD-10-CM | POA: Insufficient documentation

## 2013-01-04 DIAGNOSIS — R109 Unspecified abdominal pain: Secondary | ICD-10-CM | POA: Insufficient documentation

## 2013-01-04 DIAGNOSIS — O0930 Supervision of pregnancy with insufficient antenatal care, unspecified trimester: Secondary | ICD-10-CM | POA: Insufficient documentation

## 2013-01-04 DIAGNOSIS — O0932 Supervision of pregnancy with insufficient antenatal care, second trimester: Secondary | ICD-10-CM

## 2013-01-04 DIAGNOSIS — O219 Vomiting of pregnancy, unspecified: Secondary | ICD-10-CM

## 2013-01-04 DIAGNOSIS — R002 Palpitations: Secondary | ICD-10-CM

## 2013-01-04 DIAGNOSIS — G43909 Migraine, unspecified, not intractable, without status migrainosus: Secondary | ICD-10-CM | POA: Insufficient documentation

## 2013-01-04 DIAGNOSIS — N949 Unspecified condition associated with female genital organs and menstrual cycle: Secondary | ICD-10-CM

## 2013-01-04 DIAGNOSIS — O99891 Other specified diseases and conditions complicating pregnancy: Secondary | ICD-10-CM | POA: Insufficient documentation

## 2013-01-04 LAB — URINALYSIS, ROUTINE W REFLEX MICROSCOPIC
Bilirubin Urine: NEGATIVE
Hgb urine dipstick: NEGATIVE
Nitrite: NEGATIVE
Protein, ur: NEGATIVE mg/dL
Urobilinogen, UA: 0.2 mg/dL (ref 0.0–1.0)

## 2013-01-04 NOTE — MAU Note (Signed)
PT SAYS  WHEN SHE WAKES HER HEART IS POUNDING.    THIS ALL BEGAN  2 WEEKS AGO.  TONIGHT IT BECAME PAINFUL-    IT GOES AWAY IN 30 MIN.  DOES NOT FEEL NOW LIKE IT DID BEFORE.    NO PNC.  SHE WENT TO [facility]  IN [place]- DID UPT.     SHE IS WAITING ON MEDICAID.      DR MATTHEWS DELIVERED  OTHER BABIES.

## 2013-01-05 ENCOUNTER — Encounter (HOSPITAL_COMMUNITY): Payer: Self-pay | Admitting: Advanced Practice Midwife

## 2013-01-05 DIAGNOSIS — N949 Unspecified condition associated with female genital organs and menstrual cycle: Secondary | ICD-10-CM

## 2013-01-05 LAB — CBC
MCH: 30.8 pg (ref 26.0–34.0)
MCV: 87.3 fL (ref 78.0–100.0)
Platelets: 258 10*3/uL (ref 150–400)
RDW: 13.4 % (ref 11.5–15.5)
WBC: 14.7 10*3/uL — ABNORMAL HIGH (ref 4.0–10.5)

## 2013-01-05 MED ORDER — PROMETHAZINE HCL 25 MG PO TABS: 12.5000 mg | ORAL_TABLET | Freq: Four times a day (QID) | ORAL | Status: DC | PRN

## 2013-01-05 MED ORDER — BUTALBITAL-APAP-CAFFEINE 50-325-40 MG PO TABS: 1.0000 | ORAL_TABLET | Freq: Four times a day (QID) | ORAL | Status: DC | PRN

## 2013-01-05 MED ORDER — RANITIDINE HCL 150 MG PO TABS: 150.0000 mg | ORAL_TABLET | Freq: Every day | ORAL | Status: DC

## 2013-01-05 NOTE — MAU Provider Note (Signed)
Chief Complaint: No chief complaint on file.   First Provider Initiated Contact with Patient 01/05/13 0038     SUBJECTIVE HPI: [name] is a 37 y.o. [id number] at Unknown by LMP who presents to maternity admissions reporting pounding heart in the mornings when she wakes up. She has had frequent heartburn, nausea/vomiting, and h/a during pregnancy but none tonight in MAU.  She has also had intermittent sharp pains in her inguinal area which occur when changing position.  She is waiting for her Medicaid and does not have a provider in [place], as her provider moved to [place] since her last baby.  She takes Ambien every night for sleep and has for a few years and is taking BC powder for her h/a.  She denies LOF, vaginal bleeding, vaginal itching/burning, urinary symptoms, dizziness, or fever/chills.     Past Medical History  Diagnosis Date  . Medical history non-contributory    Past Surgical History  Procedure Laterality Date  . Cesarean section    . Tonsillectomy     History   Social History  . Marital Status: Legally Separated    Spouse Name: N/A    Number of Children: N/A  . Years of Education: N/A   Occupational History  . Not on file.   Social History Main Topics  . Smoking status: Never Smoker   . Smokeless tobacco: Not on file  . Alcohol Use: Not on file  . Drug Use: No  . Sexually Active: Yes    Birth Control/ Protection: None   Other Topics Concern  . Not on file   Social History Narrative  . No narrative on file   No current facility-administered medications on file prior to encounter.   No current outpatient prescriptions on file prior to encounter.   Allergies  Allergen Reactions  . Tramadol     Couldn't be awakened    ROS: Pertinent items in HPI  OBJECTIVE Blood pressure 118/78, pulse 97, temperature 98.7 F (37.1 C), temperature source Oral, resp. rate 20, height 5\' 2"  (1.575 m), weight 68.266 kg (150 lb 8 oz), last menstrual period  09/11/2012. GENERAL: Well-developed, well-nourished female in no acute distress.  HEENT: Normocephalic HEART: normal rate RESP: normal effort ABDOMEN: Soft, non-tender EXTREMITIES: Nontender, no edema NEURO: Alert and oriented SPECULUM EXAM: Deferred  FHR 147 by doppler  EKG with normal sinus rhythm   LAB RESULTS Results for orders placed during the hospital encounter of 01/04/13 (from the past 24 hour(s))  URINALYSIS, ROUTINE W REFLEX MICROSCOPIC     Status: None   Collection Time    01/04/13 11:26 PM      Result Value Range   Color, Urine YELLOW  YELLOW   APPearance CLEAR  CLEAR   Specific Gravity, Urine 1.010  1.005 - 1.030   pH 6.5  5.0 - 8.0   Glucose, UA NEGATIVE  NEGATIVE mg/dL   Hgb urine dipstick NEGATIVE  NEGATIVE   Bilirubin Urine NEGATIVE  NEGATIVE   Ketones, ur NEGATIVE  NEGATIVE mg/dL   Protein, ur NEGATIVE  NEGATIVE mg/dL   Urobilinogen, UA 0.2  0.0 - 1.0 mg/dL   Nitrite NEGATIVE  NEGATIVE   Leukocytes, UA NEGATIVE  NEGATIVE  CBC     Status: Abnormal   Collection Time    01/05/13 12:40 AM      Result Value Range   WBC 14.7 (*) 4.0 - 10.5 K/uL   RBC 4.03  3.87 - 5.11 MIL/uL   Hemoglobin 12.4  12.0 -  15.0 g/dL   HCT 08.6 (*) 57.8 - 46.9 %   MCV 87.3  78.0 - 100.0 fL   MCH 30.8  26.0 - 34.0 pg   MCHC 35.2  30.0 - 36.0 g/dL   RDW 62.9  52.8 - 41.3 %   Platelets 258  150 - 400 K/uL    ASSESSMENT 1. Round ligament pain   2. Migraines   3. Pounding heartbeat   4. Late prenatal care, second trimester   5. Nausea/vomiting in pregnancy     PLAN Discharge home Message sent to [facility] for prenatal appointment Drink plenty of water Zantac 150 mg BID  Phenergan 12.5-25 mg PO Q 6 hours PRN Fioricet Q6 hours PRN h/a Return to MAU as needed    Medication List    STOP taking these medications       BC HEADACHE POWDER PO      TAKE these medications       butalbital-acetaminophen-caffeine 50-325-40 MG per tablet  Commonly known as:  FIORICET  Take  1-2 tablets by mouth every 6 (six) hours as needed for headache.     prenatal multivitamin Tabs  Take 1 tablet by mouth daily at 12 noon.     ranitidine 150 MG tablet  Commonly known as:  ZANTAC  Take 1 tablet (150 mg total) by mouth at bedtime.     zolpidem 5 MG tablet  Commonly known as:  AMBIEN  Take 5 mg by mouth at bedtime as needed for sleep.         Sharen Counter Certified Nurse-Midwife 01/05/2013  12:39 AM

## 2013-01-08 ENCOUNTER — Encounter: Payer: Self-pay | Admitting: Advanced Practice Midwife

## 2013-01-10 NOTE — MAU Provider Note (Signed)
Attestation of Attending Supervision of Advanced Practitioner (CNM/NP): Evaluation and management procedures were performed by the Advanced Practitioner under my supervision and collaboration. I have reviewed the Advanced Practitioner's note and chart, and I agree with the management and plan.  Malik Ruffino H. 12:26 PM   

## 2013-01-26 ENCOUNTER — Ambulatory Visit (HOSPITAL_COMMUNITY)
Admission: RE | Admit: 2013-01-26 | Discharge: 2013-01-26 | Disposition: A | Discharge status: Home / Self Care | Payer: Self-pay | Source: Ambulatory Visit | Attending: Advanced Practice Midwife | Admitting: Advanced Practice Midwife

## 2013-01-26 ENCOUNTER — Encounter (HOSPITAL_COMMUNITY): Payer: Self-pay

## 2013-01-26 DIAGNOSIS — Z363 Encounter for antenatal screening for malformations: Secondary | ICD-10-CM | POA: Insufficient documentation

## 2013-01-26 DIAGNOSIS — O358XX0 Maternal care for other (suspected) fetal abnormality and damage, not applicable or unspecified: Secondary | ICD-10-CM | POA: Insufficient documentation

## 2013-01-26 DIAGNOSIS — O09529 Supervision of elderly multigravida, unspecified trimester: Secondary | ICD-10-CM | POA: Insufficient documentation

## 2013-01-26 DIAGNOSIS — O0932 Supervision of pregnancy with insufficient antenatal care, second trimester: Secondary | ICD-10-CM

## 2013-01-26 DIAGNOSIS — Z1389 Encounter for screening for other disorder: Secondary | ICD-10-CM | POA: Insufficient documentation

## 2013-01-26 DIAGNOSIS — O34219 Maternal care for unspecified type scar from previous cesarean delivery: Secondary | ICD-10-CM | POA: Insufficient documentation

## 2013-01-29 ENCOUNTER — Encounter: Payer: Self-pay | Admitting: Advanced Practice Midwife

## 2013-02-26 ENCOUNTER — Other Ambulatory Visit: Payer: Self-pay | Admitting: Obstetrics & Gynecology

## 2013-02-26 ENCOUNTER — Encounter: Payer: Self-pay | Admitting: Obstetrics & Gynecology

## 2013-02-26 ENCOUNTER — Ambulatory Visit (INDEPENDENT_AMBULATORY_CARE_PROVIDER_SITE_OTHER): Payer: Self-pay | Admitting: Obstetrics & Gynecology

## 2013-02-26 VITALS — Wt 166.0 lb

## 2013-02-26 DIAGNOSIS — O0932 Supervision of pregnancy with insufficient antenatal care, second trimester: Secondary | ICD-10-CM

## 2013-02-26 DIAGNOSIS — O0930 Supervision of pregnancy with insufficient antenatal care, unspecified trimester: Secondary | ICD-10-CM | POA: Insufficient documentation

## 2013-02-26 DIAGNOSIS — O34219 Maternal care for unspecified type scar from previous cesarean delivery: Secondary | ICD-10-CM | POA: Insufficient documentation

## 2013-02-26 LAB — POCT URINALYSIS DIP (DEVICE)
Protein, ur: NEGATIVE mg/dL
Urobilinogen, UA: 0.2 mg/dL (ref 0.0–1.0)

## 2013-02-26 LAB — HIV ANTIBODY (ROUTINE TESTING W REFLEX): HIV: NONREACTIVE

## 2013-02-26 NOTE — Progress Notes (Signed)
Nutrition note: 1st visit consult Pt has gained 36# @ [redacted]w[redacted]d, which is > expected. Pt reports eating 3 meals & 2 snacks/d. Pt is taking PNV.  Pt reports some heartburn but no N&V. Pt received verbal & written education on general nutrition during pregnancy. Encouraged decreasing portion sizes. Disc tips to decrease heartburn. Disc wt gain goals of 25-35# or 1#/wk. Pt agrees to cont taking PNV & try to decrease portions.  Pt does not have WIC but plans to apply. Pt plans to BF. F/u if referred Blondell Reveal, MS, RD, LDN

## 2013-02-26 NOTE — Progress Notes (Signed)
   Subjective: First prenatl visit    [name] is a [id number] [redacted]w[redacted]d being seen today for her first obstetrical visit.  Her obstetrical history is significant for 2 previous cesarean sections. Patient does intend to breast feed. Pregnancy history fully reviewed.  Patient reports no complaints.  Filed Vitals:   02/26/13 1043  Weight: 166 lb (75.297 kg)    HISTORY: OB History   Grav Para Term Preterm Abortions TAB SAB Ect Mult Living   4 3 3       3      # Outc Date GA Lbr Len/2nd Wgt Sex Del Anes PTL Lv   1 TRM 11/95 [redacted]w[redacted]d  7lb8oz(3.402kg) M SVD EPI  Yes   2 TRM 10/06 [redacted]w[redacted]d  8lb4oz(3.742kg) M LTCS   Yes   Comments: fetal distress, 7-8 cm   3 TRM 9/08 [redacted]w[redacted]d  7lb2oz(3.232kg) F LTCS   Yes   Comments: elective repeat    4 CUR              Past Medical History  Diagnosis Date  . Medical history non-contributory    Past Surgical History  Procedure Laterality Date  . Cesarean section    . Tonsillectomy     Family History  Problem Relation Age of Onset  . Asthma Neg Hx   . Diabetes Neg Hx   . Cancer Neg Hx   . COPD Neg Hx   . Hearing loss Neg Hx   . Heart disease Neg Hx   . Hypertension Neg Hx   . Miscarriages / Stillbirths Neg Hx   . Stroke Neg Hx      Exam    Uterus:     Pelvic Exam:    Perineum: No Hemorrhoids, Normal Perineum   Vulva: normal   Vagina:  normal mucosa   pH:    Cervix: no lesions   Adnexa: no mass, fullness, tenderness   Bony Pelvis: average  System: Breast:  normal appearance, no masses or tenderness   Skin: normal coloration and turgor, no rashes    Neurologic: oriented, normal mood   Extremities: normal strength, tone, and muscle mass, no deformities   HEENT neck supple with midline trachea   Mouth/Teeth mucous membranes moist, pharynx normal without lesions and dental hygiene good   Neck supple and no masses   Cardiovascular: regular rate and rhythm, no murmurs or gallops   Respiratory:  appears well, vitals normal, no  respiratory distress, acyanotic, normal RR, neck free of mass or lymphadenopathy, chest clear, no wheezing, crepitations, rhonchi, normal symmetric air entry   Abdomen: gravid, non-tender   Urinary: urethral meatus normal      Assessment:    Pregnancy: [id number] Patient Active Problem List  Diagnosis  . PANIC DISORDER  . MIGRAINES, HX OF  . Late prenatal care  . Previous cesarean delivery, delivered, with or without mention of antepartum condition        Plan:     Initial labs drawn. Prenatal vitamins. Problem list reviewed and updated. Genetic Screening too late  Ultrasound discussed; fetal survey: results reviewed.  Follow up in 4 weeks. 50% of 30 min visit spent on counseling and coordination of care.   Plan repeat cesarean section and BTL at 39 weeks  [name] 02/26/2013

## 2013-02-26 NOTE — Patient Instructions (Addendum)
Pregnancy - Second Trimester The second trimester of pregnancy (3 to 6 months) is a period of rapid growth for you and your baby. At the end of the sixth month, your baby is about 9 inches long and weighs 1 1/2 pounds. You will begin to feel the baby move between 18 and 20 weeks of the pregnancy. This is called quickening. Weight gain is faster. A clear fluid (colostrum) may leak out of your breasts. You may feel small contractions of the womb (uterus). This is known as false labor or Braxton-Hicks contractions. This is like a practice for labor when the baby is ready to be born. Usually, the problems with morning sickness have usually passed by the end of your first trimester. Some women develop small dark blotches (called cholasma, mask of pregnancy) on their face that usually goes away after the baby is born. Exposure to the sun makes the blotches worse. Acne may also develop in some pregnant women and pregnant women who have acne, may find that it goes away. PRENATAL EXAMS  Blood work may continue to be done during prenatal exams. These tests are done to check on your health and the probable health of your baby. Blood work is used to follow your blood levels (hemoglobin). Anemia (low hemoglobin) is common during pregnancy. Iron and vitamins are given to help prevent this. You will also be checked for diabetes between 24 and 28 weeks of the pregnancy. Some of the previous blood tests may be repeated.  The size of the uterus is measured during each visit. This is to make sure that the baby is continuing to grow properly according to the dates of the pregnancy.  Your blood pressure is checked every prenatal visit. This is to make sure you are not getting toxemia.  Your urine is checked to make sure you do not have an infection, diabetes or protein in the urine.  Your weight is checked often to make sure gains are happening at the suggested rate. This is to ensure that both you and your baby are growing  normally.  Sometimes, an ultrasound is performed to confirm the proper growth and development of the baby. This is a test which bounces harmless sound waves off the baby so your caregiver can more accurately determine due dates. Sometimes, a specialized test is done on the amniotic fluid surrounding the baby. This test is called an amniocentesis. The amniotic fluid is obtained by sticking a needle into the belly (abdomen). This is done to check the chromosomes in instances where there is a concern about possible genetic problems with the baby. It is also sometimes done near the end of pregnancy if an early delivery is required. In this case, it is done to help make sure the baby's lungs are mature enough for the baby to live outside of the womb. CHANGES OCCURING IN THE SECOND TRIMESTER OF PREGNANCY Your body goes through many changes during pregnancy. They vary from person to person. Talk to your caregiver about changes you notice that you are concerned about.  During the second trimester, you will likely have an increase in your appetite. It is normal to have cravings for certain foods. This varies from person to person and pregnancy to pregnancy.  Your lower abdomen will begin to bulge.  You may have to urinate more often because the uterus and baby are pressing on your bladder. It is also common to get more bladder infections during pregnancy (pain with urination). You can help this by   drinking lots of fluids and emptying your bladder before and after intercourse.  You may begin to get stretch marks on your hips, abdomen, and breasts. These are normal changes in the body during pregnancy. There are no exercises or medications to take that prevent this change.  You may begin to develop swollen and bulging veins (varicose veins) in your legs. Wearing support hose, elevating your feet for 15 minutes, 3 to 4 times a day and limiting salt in your diet helps lessen the problem.  Heartburn may develop  as the uterus grows and pushes up against the stomach. Antacids recommended by your caregiver helps with this problem. Also, eating smaller meals 4 to 5 times a day helps.  Constipation can be treated with a stool softener or adding bulk to your diet. Drinking lots of fluids, vegetables, fruits, and whole grains are helpful.  Exercising is also helpful. If you have been very active up until your pregnancy, most of these activities can be continued during your pregnancy. If you have been less active, it is helpful to start an exercise program such as walking.  Hemorrhoids (varicose veins in the rectum) may develop at the end of the second trimester. Warm sitz baths and hemorrhoid cream recommended by your caregiver helps hemorrhoid problems.  Backaches may develop during this time of your pregnancy. Avoid heavy lifting, wear low heal shoes and practice good posture to help with backache problems.  Some pregnant women develop tingling and numbness of their hand and fingers because of swelling and tightening of ligaments in the wrist (carpel tunnel syndrome). This goes away after the baby is born.  As your breasts enlarge, you may have to get a bigger bra. Get a comfortable, cotton, support bra. Do not get a nursing bra until the last month of the pregnancy if you will be nursing the baby.  You may get a dark line from your belly button to the pubic area called the linea nigra.  You may develop rosy cheeks because of increase blood flow to the face.  You may develop spider looking lines of the face, neck, arms and chest. These go away after the baby is born. HOME CARE INSTRUCTIONS   It is extremely important to avoid all smoking, herbs, alcohol, and unprescribed drugs during your pregnancy. These chemicals affect the formation and growth of the baby. Avoid these chemicals throughout the pregnancy to ensure the delivery of a healthy infant.  Most of your home care instructions are the same as  suggested for the first trimester of your pregnancy. Keep your caregiver's appointments. Follow your caregiver's instructions regarding medication use, exercise and diet.  During pregnancy, you are providing food for you and your baby. Continue to eat regular, well-balanced meals. Choose foods such as meat, fish, milk and other low fat dairy products, vegetables, fruits, and whole-grain breads and cereals. Your caregiver will tell you of the ideal weight gain.  A physical sexual relationship may be continued up until near the end of pregnancy if there are no other problems. Problems could include early (premature) leaking of amniotic fluid from the membranes, vaginal bleeding, abdominal pain, or other medical or pregnancy problems.  Exercise regularly if there are no restrictions. Check with your caregiver if you are unsure of the safety of some of your exercises. The greatest weight gain will occur in the last 2 trimesters of pregnancy. Exercise will help you:  Control your weight.  Get you in shape for labor and delivery.  Lose weight   after you have the baby.  Wear a good support or jogging bra for breast tenderness during pregnancy. This may help if worn during sleep. Pads or tissues may be used in the bra if you are leaking colostrum.  Do not use hot tubs, steam rooms or saunas throughout the pregnancy.  Wear your seat belt at all times when driving. This protects you and your baby if you are in an accident.  Avoid raw meat, uncooked cheese, cat litter boxes and soil used by cats. These carry germs that can cause birth defects in the baby.  The second trimester is also a good time to visit your dentist for your dental health if this has not been done yet. Getting your teeth cleaned is OK. Use a soft toothbrush. Brush gently during pregnancy.  It is easier to loose urine during pregnancy. Tightening up and strengthening the pelvic muscles will help with this problem. Practice stopping your  urination while you are going to the bathroom. These are the same muscles you need to strengthen. It is also the muscles you would use as if you were trying to stop from passing gas. You can practice tightening these muscles up 10 times a set and repeating this about 3 times per day. Once you know what muscles to tighten up, do not perform these exercises during urination. It is more likely to contribute to an infection by backing up the urine.  Ask for help if you have financial, counseling or nutritional needs during pregnancy. Your caregiver will be able to offer counseling for these needs as well as refer you for other special needs.  Your skin may become oily. If so, wash your face with mild soap, use non-greasy moisturizer and oil or cream based makeup. MEDICATIONS AND DRUG USE IN PREGNANCY  Take prenatal vitamins as directed. The vitamin should contain 1 milligram of folic acid. Keep all vitamins out of reach of children. Only a couple vitamins or tablets containing iron may be fatal to a baby or [place] child when ingested.  Avoid use of all medications, including herbs, over-the-counter medications, not prescribed or suggested by your caregiver. Only take over-the-counter or prescription medicines for pain, discomfort, or fever as directed by your caregiver. Do not use aspirin.  Let your caregiver also know about herbs you may be using.  Alcohol is related to a number of birth defects. This includes fetal alcohol syndrome. All alcohol, in any form, should be avoided completely. Smoking will cause low birth rate and premature babies.  Street or illegal drugs are very harmful to the baby. They are absolutely forbidden. A baby born to an addicted mother will be addicted at birth. The baby will go through the same withdrawal an adult does. SEEK MEDICAL CARE IF:  You have any concerns or worries during your pregnancy. It is better to call with your questions if you feel they cannot wait, rather  than worry about them. SEEK IMMEDIATE MEDICAL CARE IF:   An unexplained oral temperature above 102 F (38.9 C) develops, or as your caregiver suggests.  You have leaking of fluid from the vagina (birth canal). If leaking membranes are suspected, take your temperature and tell your caregiver of this when you call.  There is vaginal spotting, bleeding, or passing clots. Tell your caregiver of the amount and how many pads are used. Light spotting in pregnancy is common, especially following intercourse.  You develop a bad smelling vaginal discharge with a change in the color from clear   to white.  You continue to feel sick to your stomach (nauseated) and have no relief from remedies suggested. You vomit blood or coffee ground-like materials.  You lose more than 2 pounds of weight or gain more than 2 pounds of weight over 1 week, or as suggested by your caregiver.  You notice swelling of your face, hands, feet, or legs.  You get exposed to German measles and have never had them.  You are exposed to fifth disease or chickenpox.  You develop belly (abdominal) pain. Round ligament discomfort is a common non-cancerous (benign) cause of abdominal pain in pregnancy. Your caregiver still must evaluate you.  You develop a bad headache that does not go away.  You develop fever, diarrhea, pain with urination, or shortness of breath.  You develop visual problems, blurry, or double vision.  You fall or are in a car accident or any kind of trauma.  There is mental or physical violence at home. Document Released: 10/19/2001 Document Revised: 01/17/2012 Document Reviewed: 04/23/2009 Exit[employer] Patient Information 2013 [employer].  

## 2013-02-27 LAB — OBSTETRIC PANEL
Basophils Absolute: 0 10*3/uL (ref 0.0–0.1)
Lymphocytes Relative: 20 % (ref 12–46)
Neutro Abs: 8.1 10*3/uL — ABNORMAL HIGH (ref 1.7–7.7)
Neutrophils Relative %: 71 % (ref 43–77)
Platelets: 286 10*3/uL (ref 150–400)
RDW: 13.5 % (ref 11.5–15.5)
Rubella: 6.28 Index — ABNORMAL HIGH (ref <0.90)
WBC: 11.4 10*3/uL — ABNORMAL HIGH (ref 4.0–10.5)

## 2013-03-26 ENCOUNTER — Encounter: Payer: Self-pay | Admitting: Obstetrics & Gynecology

## 2013-04-16 ENCOUNTER — Ambulatory Visit (INDEPENDENT_AMBULATORY_CARE_PROVIDER_SITE_OTHER): Payer: Medicaid Other | Admitting: Obstetrics & Gynecology

## 2013-04-16 ENCOUNTER — Encounter: Payer: Self-pay | Admitting: Obstetrics & Gynecology

## 2013-04-16 ENCOUNTER — Telehealth: Payer: Self-pay | Admitting: *Deleted

## 2013-04-16 VITALS — BP 115/78 | Temp 97.7°F | Wt 172.2 lb

## 2013-04-16 DIAGNOSIS — Z87898 Personal history of other specified conditions: Secondary | ICD-10-CM

## 2013-04-16 DIAGNOSIS — O0930 Supervision of pregnancy with insufficient antenatal care, unspecified trimester: Secondary | ICD-10-CM

## 2013-04-16 DIAGNOSIS — O34219 Maternal care for unspecified type scar from previous cesarean delivery: Secondary | ICD-10-CM

## 2013-04-16 DIAGNOSIS — O0933 Supervision of pregnancy with insufficient antenatal care, third trimester: Secondary | ICD-10-CM

## 2013-04-16 LAB — POCT URINALYSIS DIP (DEVICE)
Ketones, ur: NEGATIVE mg/dL
Protein, ur: NEGATIVE mg/dL
Specific Gravity, Urine: >=1.03 (ref 1.005–1.030)
pH: 6 (ref 5.0–8.0)

## 2013-04-16 LAB — CBC
HCT: 34.6 % — ABNORMAL LOW (ref 36.0–46.0)
Hemoglobin: 12 g/dL (ref 12.0–15.0)
MCHC: 34.7 g/dL (ref 30.0–36.0)

## 2013-04-16 MED ORDER — BUTALBITAL-APAP-CAFFEINE 50-325-40 MG PO TABS: 1.0000 | ORAL_TABLET | Freq: Four times a day (QID) | ORAL | Status: DC | PRN

## 2013-04-16 MED ORDER — PRENATAL MULTIVITAMIN CH: 1.0000 | ORAL_TABLET | Freq: Every day | ORAL | Status: AC

## 2013-04-16 NOTE — Patient Instructions (Signed)
Cesarean Delivery  Care After  Refer to this sheet in the next few weeks. These instructions provide you with information on caring for yourself after your procedure. Your caregiver may also give you specific instructions. Your treatment has been planned according to current medical practices, but problems sometimes occur. Call your caregiver if you have any problems or questions after you go home.  HOME CARE INSTRUCTIONS   · Only take over-the-counter or prescription medicines as directed by your caregiver.  · Do not drink alcohol, especially if you are breastfeeding or taking medicine to relieve pain.  · Do not chew or smoke tobacco.  · Continue to use good perineal care. Good perineal care includes:  · Wiping your perineum from front to back.  · Keeping your perineum clean.  · Check your cut (incision) daily for increased redness, drainage, swelling, or separation of skin.  · Clean your incision gently with soap and water every day, and then pat it dry. If your caregiver says it is okay, leave the incision uncovered. Use a bandage (dressing) if the incision is draining fluid or appears irritated. If the adhesive strips across the incision do not fall off within 7 days, carefully peel them off.  · Hug a pillow when coughing or sneezing until your incision is healed. This helps to relieve pain.  · Do not use tampons or douche until your caregiver says it is okay.  · Shower, wash your hair, and take tub baths as directed by your caregiver.  · Wear a well-fitting bra that provides breast support.  · Limit wearing support panties or control-top hose.  · Drink enough fluids to keep your urine clear or pale yellow.  · Eat high-fiber foods such as whole grain cereals and breads, brown rice, beans, and fresh fruits and vegetables every day. These foods may help prevent or relieve constipation.  · Resume activities such as climbing stairs, driving, lifting, exercising, or traveling as directed by your caregiver.  · Talk to  your caregiver about resuming sexual activities. This is dependent upon your risk of infection, your rate of healing, and your comfort and desire to resume sexual activity.  · Try to have someone help you with your household activities and your newborn for at least a few days after you leave the hospital.  · Rest as much as possible. Try to rest or take a nap when your newborn is sleeping.  · Increase your activities gradually.  · Keep all of your scheduled postpartum appointments. It is very important to keep your scheduled follow-up appointments. At these appointments, your caregiver will be checking to make sure that you are healing physically and emotionally.  SEEK MEDICAL CARE IF:   · You are passing large clots from your vagina. Save any clots to show your caregiver.  · You have a foul smelling discharge from your vagina.  · You have trouble urinating.  · You are urinating frequently.  · You have pain when you urinate.  · You have a change in your bowel movements.  · You have increasing redness, pain, or swelling near your incision.  · You have pus draining from your incision.  · Your incision is separating.  · You have painful, hard, or reddened breasts.  · You have a severe headache.  · You have blurred vision or see spots.  · You feel sad or depressed.  · You have thoughts of hurting yourself or your newborn.  · You have questions about your care, the   care of your newborn, or medicines.  · You are dizzy or lightheaded.  · You have a rash.  · You have pain, redness, or swelling at the site of the removed intravenous access (IV) tube.  · You have nausea or vomiting.  · You stopped breastfeeding and have not had a menstrual period within 12 weeks of stopping.  · You are not breastfeeding and have not had a menstrual period within 12 weeks of delivery.  · You have a fever.  SEEK IMMEDIATE MEDICAL CARE IF:  · You have persistent pain.  · You have chest pain.  · You have shortness of breath.  · You faint.  · You  have leg pain.  · You have stomach pain.  · Your vaginal bleeding saturates 2 or more sanitary pads in 1 hour.  MAKE SURE YOU:   · Understand these instructions.  · Will watch your condition.  · Will get help right away if you are not doing well or get worse.  Document Released: 07/17/2002 Document Revised: 07/19/2012 Document Reviewed: 06/21/2012  [employer]® Patient Information ©2014 [employer].

## 2013-04-16 NOTE — Telephone Encounter (Signed)
Called prescription in for fioricet- had printed instead of eprescribe and found after patient had left

## 2013-04-16 NOTE — Progress Notes (Signed)
Discussed scheduled CS would be ideal to be 39 weeks, 06/11/13

## 2013-04-16 NOTE — Progress Notes (Signed)
P=120, c/o trace edema legs, feet, hands, face, c/o irregular contractions, not daily.

## 2013-04-17 LAB — RPR

## 2013-04-20 ENCOUNTER — Telehealth: Payer: Self-pay | Admitting: *Deleted

## 2013-04-20 NOTE — Telephone Encounter (Signed)
Patient left a message that she needs a written rx for her cpap machine mask. States that the doctor she saw at the last visit told her he would give her one.

## 2013-04-28 ENCOUNTER — Encounter (HOSPITAL_COMMUNITY): Payer: Self-pay

## 2013-04-28 ENCOUNTER — Inpatient Hospital Stay (HOSPITAL_COMMUNITY)
Admission: AD | Admit: 2013-04-28 | Discharge: 2013-04-29 | Disposition: A | Discharge status: Home / Self Care | Payer: Medicaid Other | Source: Ambulatory Visit | Attending: Obstetrics & Gynecology | Admitting: Obstetrics & Gynecology

## 2013-04-28 DIAGNOSIS — O212 Late vomiting of pregnancy: Secondary | ICD-10-CM | POA: Insufficient documentation

## 2013-04-28 DIAGNOSIS — O99613 Diseases of the digestive system complicating pregnancy, third trimester: Secondary | ICD-10-CM

## 2013-04-28 DIAGNOSIS — O99891 Other specified diseases and conditions complicating pregnancy: Secondary | ICD-10-CM | POA: Insufficient documentation

## 2013-04-28 DIAGNOSIS — R51 Headache: Secondary | ICD-10-CM | POA: Insufficient documentation

## 2013-04-28 DIAGNOSIS — K219 Gastro-esophageal reflux disease without esophagitis: Secondary | ICD-10-CM | POA: Insufficient documentation

## 2013-04-28 DIAGNOSIS — O26893 Other specified pregnancy related conditions, third trimester: Secondary | ICD-10-CM

## 2013-04-29 DIAGNOSIS — O212 Late vomiting of pregnancy: Secondary | ICD-10-CM

## 2013-04-29 DIAGNOSIS — K219 Gastro-esophageal reflux disease without esophagitis: Secondary | ICD-10-CM

## 2013-04-29 DIAGNOSIS — R51 Headache: Secondary | ICD-10-CM

## 2013-04-29 LAB — URINALYSIS, ROUTINE W REFLEX MICROSCOPIC
Glucose, UA: NEGATIVE mg/dL
Leukocytes, UA: NEGATIVE
Specific Gravity, Urine: 1.02 (ref 1.005–1.030)
pH: 7 (ref 5.0–8.0)

## 2013-04-29 MED ORDER — BUTALBITAL-APAP-CAFFEINE 50-325-40 MG PO TABS
1.0000 | ORAL_TABLET | Freq: Once | ORAL | Status: DC
  Filled 2013-04-29: qty 1

## 2013-04-29 MED ORDER — ACETAMINOPHEN 325 MG PO TABS
650.0000 mg | ORAL_TABLET | Freq: Once | ORAL | Status: AC
  Administered 2013-04-29: 650 mg via ORAL
  Filled 2013-04-29: qty 2

## 2013-04-29 MED ORDER — GI COCKTAIL ~~LOC~~
30.0000 mL | Freq: Once | ORAL | Status: AC
  Administered 2013-04-29: 30 mL via ORAL
  Filled 2013-04-29: qty 30

## 2013-04-29 MED ORDER — OXYCODONE-ACETAMINOPHEN 5-325 MG PO TABS
1.0000 | ORAL_TABLET | Freq: Once | ORAL | Status: AC
  Administered 2013-04-29: 1 via ORAL
  Filled 2013-04-29: qty 1

## 2013-04-29 NOTE — Progress Notes (Signed)
Written and verbal d/c instructions given and understanding voiced. 

## 2013-04-29 NOTE — MAU Note (Signed)
Has not feel good the last couple of days, history of migraines has not been bothered by them during the pregnancy, heartburn has flared up and Pepcid is not helping, headache is frontal.

## 2013-04-29 NOTE — MAU Provider Note (Signed)
History     CSN: [id number]  Arrival date and time: 04/28/13 2346   First Provider Initiated Contact with Patient 04/29/13 0117      Chief Complaint  Patient presents with  . Nausea  . Headache  . Blurred Vision   HPI [name] is a 37 y.o. [phone] female @ [redacted]w[redacted]d who presents w/ report of frontal ha x few days, worse today, took 1 fioricet earlier w/o much relief- had blurred vision earlier after vomiting- none now. Also worsening GERD, on zantac daily- no longer helping. Has been nauseated today and vomited x 4 'd/t reflux'.  Denies ruq/epigastric pain. Reports good fm. Denies uc's/cramping, lof, or vb. Next appt on Monday in clinic.   OB History   Grav Para Term Preterm Abortions TAB SAB Ect Mult Living   4 3 3       3       Past Medical History  Diagnosis Date  . Medical history non-contributory     Past Surgical History  Procedure Laterality Date  . Cesarean section    . Tonsillectomy      Family History  Problem Relation Age of Onset  . Asthma Neg Hx   . Diabetes Neg Hx   . Cancer Neg Hx   . COPD Neg Hx   . Hearing loss Neg Hx   . Heart disease Neg Hx   . Hypertension Neg Hx   . Miscarriages / Stillbirths Neg Hx   . Stroke Neg Hx     History  Substance Use Topics  . Smoking status: Never Smoker   . Smokeless tobacco: Former Neurosurgeon  . Alcohol Use: No    Allergies:  Allergies  Allergen Reactions  . Tramadol     Couldn't be awakened    Prescriptions prior to admission  Medication Sig Dispense Refill  . butalbital-acetaminophen-caffeine (FIORICET) 50-325-40 MG per tablet Take 1-2 tablets by mouth every 6 (six) hours as needed for headache.  20 tablet  2  . Prenatal Vit-Fe Fumarate-FA (PRENATAL MULTIVITAMIN) TABS Take 1 tablet by mouth daily at 12 noon.  30 tablet  2  . promethazine (PHENERGAN) 25 MG tablet Take 0.5-1 tablets (12.5-25 mg total) by mouth every 6 (six) hours as needed for nausea.  30 tablet  2  . ranitidine (ZANTAC) 150 MG  tablet Take 1 tablet (150 mg total) by mouth at bedtime.  30 tablet  5  . zolpidem (AMBIEN) 5 MG tablet Take 5 mg by mouth at bedtime as needed for sleep.        Review of Systems  Constitutional: Negative.  Negative for fever and chills.  Eyes: Positive for blurred vision (earlier this evening, denies at present).  Respiratory: Negative.   Gastrointestinal: Positive for heartburn (zantac not helping), nausea and vomiting (vomited x 4 today, thinks d/t bad reflux). Negative for abdominal pain, diarrhea and constipation.  Genitourinary: Negative.   Musculoskeletal: Negative.   Skin: Negative.   Neurological: Positive for headaches (frontal ha x few days, worse today).  Endo/Heme/Allergies: Negative.   Psychiatric/Behavioral: Negative.    Physical Exam   Blood pressure 113/73, pulse 115, temperature 98.3 F (36.8 C), temperature source Oral, resp. rate 18, last menstrual period 09/11/2012, SpO2 97.00%.  Physical Exam  Constitutional: She is oriented to person, place, and time. She appears well-developed and well-nourished.  HENT:  Head: Normocephalic.  Neck: Normal range of motion.  Cardiovascular: Normal rate.   Respiratory: Effort normal.  GI: Soft. There is no tenderness.  gravid  Genitourinary:  deferred  Musculoskeletal: Normal range of motion.  Neurological: She is alert and oriented to person, place, and time. She has normal reflexes.  Skin: Skin is warm and dry.  Psychiatric: She has a normal mood and affect. Her behavior is normal. Judgment and thought content normal.   FHR: 135, mod variability, 15x15accels, no decels=Cat I UCs: none  MAU Course  Procedures UA NST Tylenol 650mg  po Percocet 1 po GI cocktail  Results for orders placed during the hospital encounter of 04/28/13 (from the past 24 hour(s))  URINALYSIS, ROUTINE W REFLEX MICROSCOPIC     Status: None   Collection Time    04/28/13 11:50 PM      Result Value Range   Color, Urine YELLOW  YELLOW    APPearance CLEAR  CLEAR   Specific Gravity, Urine 1.020  1.005 - 1.030   pH 7.0  5.0 - 8.0   Glucose, UA NEGATIVE  NEGATIVE mg/dL   Hgb urine dipstick NEGATIVE  NEGATIVE   Bilirubin Urine NEGATIVE  NEGATIVE   Ketones, ur NEGATIVE  NEGATIVE mg/dL   Protein, ur NEGATIVE  NEGATIVE mg/dL   Urobilinogen, UA 0.2  0.0 - 1.0 mg/dL   Nitrite NEGATIVE  NEGATIVE   Leukocytes, UA NEGATIVE  NEGATIVE    Assessment and Plan  A:  [redacted]w[redacted]d SIUP  [id number]   Headache  GERD in pregnancy  Cat I FHR   P:  D/C home  Keep appt at clinic as scheduled Mon  Take fioricet as previously directed for ha  Stop zantac, switch to OTC prilosec or nexium daily  Reviewed and recommended to avoid GERD triggers  Discussed ptl s/s, fetal kick counts, reasons to return  Marge Duncans 04/29/2013, 1:18 AM

## 2013-04-30 ENCOUNTER — Encounter: Payer: Self-pay | Admitting: Family Medicine

## 2013-04-30 ENCOUNTER — Ambulatory Visit (INDEPENDENT_AMBULATORY_CARE_PROVIDER_SITE_OTHER): Payer: Medicaid Other | Admitting: Family Medicine

## 2013-04-30 VITALS — BP 114/78 | Wt 178.9 lb

## 2013-04-30 DIAGNOSIS — O360990 Maternal care for other rhesus isoimmunization, unspecified trimester, not applicable or unspecified: Secondary | ICD-10-CM

## 2013-04-30 DIAGNOSIS — O99810 Abnormal glucose complicating pregnancy: Secondary | ICD-10-CM

## 2013-04-30 DIAGNOSIS — O0933 Supervision of pregnancy with insufficient antenatal care, third trimester: Secondary | ICD-10-CM

## 2013-04-30 DIAGNOSIS — O0930 Supervision of pregnancy with insufficient antenatal care, unspecified trimester: Secondary | ICD-10-CM

## 2013-04-30 DIAGNOSIS — O360130 Maternal care for anti-D [Rh] antibodies, third trimester, not applicable or unspecified: Secondary | ICD-10-CM

## 2013-04-30 DIAGNOSIS — G4733 Obstructive sleep apnea (adult) (pediatric): Secondary | ICD-10-CM

## 2013-04-30 LAB — POCT URINALYSIS DIP (DEVICE)
Hgb urine dipstick: NEGATIVE
Protein, ur: NEGATIVE mg/dL
Specific Gravity, Urine: >=1.03 (ref 1.005–1.030)
Urobilinogen, UA: 0.2 mg/dL (ref 0.0–1.0)

## 2013-04-30 MED ORDER — PANTOPRAZOLE SODIUM 40 MG PO TBEC: 40.0000 mg | DELAYED_RELEASE_TABLET | Freq: Every day | ORAL | Status: DC

## 2013-04-30 MED ORDER — RHO D IMMUNE GLOBULIN 1500 UNIT/2ML IJ SOLN
300.0000 ug | Freq: Once | INTRAMUSCULAR | Status: AC
  Administered 2013-04-30: 300 ug via INTRAMUSCULAR

## 2013-04-30 NOTE — Progress Notes (Signed)
Sleep study scheduled at [facility] on 05/22/13.

## 2013-04-30 NOTE — Progress Notes (Signed)
Pulse 117 Abnormal 1 hr gtt needs 3hr

## 2013-04-30 NOTE — Progress Notes (Signed)
Has sleep apnea, mask cracked - new supplies ordered. Has not been calibrated/checked in a long time. Will order sleep study for updated settings. Elevated 1 hour GTT, 3 hour ordered. Sign BTL consent today. Rh negative, Rhogam today.

## 2013-04-30 NOTE — Patient Instructions (Addendum)
Heartburn During Pregnancy   Heartburn is a burning sensation in the chest caused by stomach acid backing up into the esophagus. Heartburn (also known as "reflux") is common in pregnancy because a certain hormone (progesterone) changes. The progesterone hormone may relax the valve that separates the esophagus from the stomach. This allows acid to go up into the esophagus, causing heartburn. Heartburn may also happen in pregnancy because the enlarging uterus pushes up on the stomach, which pushes more acid into the esophagus. This is especially true in the later stages of pregnancy. Heartburn problems usually go away after giving birth.  CAUSES   · The progesterone hormone.  · Changing hormone levels.  · The growing uterus that pushes stomach acid upward.  · Large meals.  · Certain foods and drinks.  · Exercise.  · Increased acid production.  SYMPTOMS   · Burning pain in the chest or lower throat.  · Bitter taste in the mouth.  · Coughing.  DIAGNOSIS   Heartburn is typically diagnosed by your caregiver when taking a careful history of your concern. Your caregiver may order a blood test to check for a certain type of bacteria that is associated with heartburn. Sometimes, heartburn is diagnosed by prescribing a heartburn medicine to see if the symptoms improve. It is rare in pregnancy to have a procedure called an endoscopy. This is when a tube with a light and a camera on the end is used to examine the esophagus and the stomach.  TREATMENT   · Your caregiver may tell you to use certain over-the-counter medicines (antacids, acid reducers) for mild heartburn.  · Your caregiver may prescribe medicines to decrease stomach acid or to protect your stomach lining.  · Your caregiver may recommend certain diet changes.  · For severe cases, your caregiver may recommend that the head of the bed be elevated on blocks. (Sleeping with more pillows is not an effective treatment as it only changes the position of your head and does  not improve the main problem of stomach acid refluxing into the esophagus.)  HOME CARE INSTRUCTIONS   · Take all medicines as directed by your caregiver.  · Raise the head of your bed by putting blocks under the legs if instructed to by your caregiver.  · Do not exercise right after eating.  · Avoid eating 2 or 3 hours before bed. Do not lie down right after eating.  · Eat small meals throughout the day instead of 3 large meals.  · Identify foods and beverages that make your symptoms worse and avoid them. Foods you may want to avoid include:  · Peppers.  · Chocolate.  · High-fat foods, including fried foods.  · Spicy foods.  · Garlic and onions.  · Citrus fruits, including oranges, grapefruit, lemons, and limes.  · Food containing tomatoes or tomato products.  · Mint.  · Carbonated and caffeinated drinks.  · Vinegar.  SEEK IMMEDIATE MEDICAL CARE IF:   · You have severe chest pain that goes down your arm or into your jaw or neck.  · You feel sweaty, dizzy, or lightheaded.  · You become short of breath.  · You vomit blood.  · You have difficulty or pain with swallowing.  · You have bloody or black, tarry stools.  · You have episodes of heartburn more than 3 times a week, for more than 2 weeks.  MAKE SURE YOU:  · Understand these instructions.  · Will watch your condition.  · Will get 

## 2013-05-01 ENCOUNTER — Ambulatory Visit (HOSPITAL_BASED_OUTPATIENT_CLINIC_OR_DEPARTMENT_OTHER): Payer: Medicaid Other | Attending: Family Medicine | Admitting: Radiology

## 2013-05-01 VITALS — Ht 61.0 in | Wt 198.0 lb

## 2013-05-01 DIAGNOSIS — G4733 Obstructive sleep apnea (adult) (pediatric): Secondary | ICD-10-CM | POA: Insufficient documentation

## 2013-05-01 DIAGNOSIS — O99891 Other specified diseases and conditions complicating pregnancy: Secondary | ICD-10-CM | POA: Insufficient documentation

## 2013-05-04 ENCOUNTER — Encounter: Payer: Self-pay | Admitting: *Deleted

## 2013-05-06 DIAGNOSIS — R0989 Other specified symptoms and signs involving the circulatory and respiratory systems: Secondary | ICD-10-CM

## 2013-05-06 DIAGNOSIS — R0609 Other forms of dyspnea: Secondary | ICD-10-CM

## 2013-05-06 DIAGNOSIS — G4733 Obstructive sleep apnea (adult) (pediatric): Secondary | ICD-10-CM

## 2013-05-06 NOTE — Procedures (Signed)
NAME:  [name], [name] NO.:  [id number]  MEDICAL RECORD NO.:  [id number]          PATIENT TYPE:  OUT  LOCATION:  SLEEP CENTER                 FACILITY:  [facility]  PHYSICIAN:  Clinton D. Maple Hudson, MD, FCCP, FACPDATE OF BIRTH:  1975-09-06  DATE OF STUDY:  05/01/2013                           NOCTURNAL POLYSOMNOGRAM  REFERRING PHYSICIAN:  PAMELA FERRY  INDICATION FOR STUDY:  Hypersomnia with sleep apnea.  EPWORTH SLEEPINESS SCORE:  12/24.  BMI 37.4, weight 198 pounds, height 61 inches, neck 13.5 inches.  MEDICATIONS:  Home medications are charted for review.  A previous diagnostic sleep study on August 17, 2005, had recorded an AHI 116 per hour.  The patient was in third trimester pregnancy at that time.  SLEEP ARCHITECTURE:  Split study protocol.  During the diagnostic phase, total sleep time 124.5 minutes with sleep efficiency 79.6%.  Stage I was 28.9%, stage II 71.1%, stage III and REM were absent.  Sleep latency 9.5 minutes.  Awake after sleep onset 18 minutes.  Arousal index 51.1.  Bedtime Medication:  Ambien.  RESPIRATORY DATA:  Split study protocol.  Apnea/hypopnea index (AHI) 56.9 per hour.  A total of 118 events was scored including 63 obstructive apneas and 55 hypopneas.  Events were associated with nonsupine sleep position.  CPAP was then titrated to 9 CWP, AHI 0 per hour.  She wore a medium ResMed Quattro FX full-face mask with heated humidifier.  OXYGEN DATA:  Moderate snoring before CPAP with oxygen desaturation to a nadir of 75% on room air.  With CPAP control, mean oxygen saturation held 94.4% on room air and snoring was prevented.  CARDIAC DATA:  Sinus rhythm with PACs and occasional PVC.  MOVEMENT-PARASOMNIA:  No significant movement disturbance.  Bathroom x1.  IMPRESSIONS-RECOMMENDATIONS: 1. Severe obstructive sleep apnea/hypopnea syndrome, AHI 56.9 per hour     with non-supine events.  Moderate snoring before CPAP with oxygen  desaturation to a nadir of 75% on room air. 2. Successful CPAP titration to 9 CWP, AHI 0 per hour.  She wore a     medium ResMed Quattro FX full-face mask with heated humidifier.     Snoring was prevented and mean oxygen saturation held 94.4% on room     air. 3. The patient is reported to be [redacted] weeks pregnant at the time of this     study.  Previous diagnostic study on August 17, 2005, had recorded     an AHI of 116 per hour.  She was in third trimester pregnancy for     that study as well.     Clinton D. Maple Hudson, MD, [facility], FACP Diplomate, American Board of Sleep Medicine    CDY/MEDQ  D:  05/06/2013 15:14:25  T:  05/06/2013 16:10:96  Job:  [id number]

## 2013-05-07 ENCOUNTER — Telehealth: Payer: Self-pay | Admitting: *Deleted

## 2013-05-07 DIAGNOSIS — G4730 Sleep apnea, unspecified: Secondary | ICD-10-CM

## 2013-05-07 NOTE — Progress Notes (Signed)
Notified by clinic staff of pts need for a sleep apnea machine.  Had recent sleep study that showed severe sleep apnea and is [redacted] wks gestation.  Pt had a sleep study in 2006 and has a cpap machine resulting from that study.  Her mask is broken and not sure of the condition of the cpap machine itself.  Spoke w/ pt via phone at [phone], she used [facility] in the past and would like to use them for this also.  Explained to pt that I would notify [facility] and let them know that she had a recent sleep study and needs a cpap machine.  Requested that the pt call me back on Wednesday if she has not heard from [facility] by then ([phone]) pt voiced understanding.  I called and left a message w/ Mayra Reel at [facility] requesting a call back.  TJohnson, RNBSN  [phone]

## 2013-05-07 NOTE — Telephone Encounter (Signed)
Called [name] and informed her we had the results and she does need either new mask and/or machine and I would talk with a physician and then send order to our case manager who will contact [facility].  Kizzie Ide , case manager and gave her the order.

## 2013-05-07 NOTE — Telephone Encounter (Signed)
[name] called and left a message that she spoke with someone at [facility] where they do the sleep study and they faxed results to [place]- wants to know if we are faxing them to [facility] so she can get her machine and mask. Request a call.

## 2013-05-10 ENCOUNTER — Telehealth: Payer: Self-pay

## 2013-05-10 ENCOUNTER — Telehealth: Payer: Self-pay | Admitting: *Deleted

## 2013-05-10 ENCOUNTER — Telehealth: Payer: Self-pay | Admitting: Internal Medicine

## 2013-05-10 DIAGNOSIS — G4733 Obstructive sleep apnea (adult) (pediatric): Secondary | ICD-10-CM

## 2013-05-10 NOTE — Telephone Encounter (Signed)
Pt called back and scheduled appt for Monday 05/14/13 @ 3:00 with Dr Maple Hudson.  Nothing further needed. Leanora Ivanoff

## 2013-05-10 NOTE — Telephone Encounter (Signed)
At this time, CDY had a cancelation on Monday's schedule.   It is held for 3 and 3:15 slot. lmomtcb -- can pt come in for this appt? Per Florentina Addison, ok to use this held time for pt.

## 2013-05-10 NOTE — Telephone Encounter (Signed)
Order- DME change her CPAP to autoPAP 5-15cwp, mask of choice, humidifier, supplies, dx OSA  Initial Rx apparently from another doctor. We would need to see her her to establish to do anything more than this one time order.

## 2013-05-10 NOTE — Progress Notes (Signed)
Pt called today at 1445p stating that she had gotten the CPAP machine from [facility] but that she was still unable to breath while using it.  I called and spoke w/ Dr. Thad Ranger in the clinic who called [facility] (did the sleep study).  [place] suggested that pt call them to discuss the issue that she was having w/ the CPAP machine.  I called the pt back at [phone] and explained the above.  Gave her the number for [facility] [phone] and choose option for Pulmonary Critical Care.  If any further questions please let us know.  TJohnson, RNBSN   [phone]

## 2013-05-10 NOTE — Telephone Encounter (Signed)
Spoke with patient, patient aware orderd have been placed also advised more recs when established as patient Orders placed and nothing further needed at this time

## 2013-05-10 NOTE — Telephone Encounter (Signed)
Spoke with patient, patient states she has switched her mask and is still snoring and not sleeping This is concerning to the patient due to her pregnancy, patient states she feels paranoid Requesting a call back tonight Dr. Maple Hudson please advise, thank you  Next OV: 05/14/13 for Consult

## 2013-05-14 ENCOUNTER — Encounter: Payer: Self-pay | Admitting: *Deleted

## 2013-05-14 ENCOUNTER — Ambulatory Visit (INDEPENDENT_AMBULATORY_CARE_PROVIDER_SITE_OTHER): Payer: Medicaid Other | Admitting: Internal Medicine

## 2013-05-14 ENCOUNTER — Encounter: Payer: Self-pay | Admitting: Internal Medicine

## 2013-05-14 ENCOUNTER — Encounter: Payer: Medicaid Other | Admitting: Family Medicine

## 2013-05-14 VITALS — BP 110/70 | HR 134 | Ht 61.0 in | Wt 182.4 lb

## 2013-05-14 DIAGNOSIS — G4733 Obstructive sleep apnea (adult) (pediatric): Secondary | ICD-10-CM

## 2013-05-14 NOTE — Telephone Encounter (Signed)
Will sign off as pt has pending Consult today.

## 2013-05-14 NOTE — Patient Instructions (Addendum)
We can continue CPAP autotitration 5-15/ [facility] until after your baby is born, then reconsider.  Please call as needed  We can give you a "to Whom it may concern letter" - Advising that you are under our care for obstructive sleep apnea and can have routine dental care.

## 2013-05-14 NOTE — Progress Notes (Signed)
Subjective:    Patient ID: [name], female    DOB: [birth year], 37 y.o.   MRN: [id number]  HPI 05/14/13- 29 yoF former smoker Ref by Dr. Rob Bunting score: 17- pt reports severe sleep apnea w pregnancy 8 yrs ago, currently pregnant/ Third trimester and still having sleeping problems-- wakes up gasping or choking. Snoring loudly  improved after childbirth with her first pregnancy. Now pregnant again. Has gained 85 pounds. Bedtime between 2 and 3 AM, 20 minutes sleep latency, waking 2 or 3 times before up at 8:30 AM. Has had tonsillectomy. Denies heart or lung problem. NPSG 05/01/13- AHI 56.9/ hr, weight 198 lbs. CPAP titrated to 9/ AHI 0. She is using CPAP autopap 5-15/ [facility] w/ nasal pillows. She is unemployed, living with husband. Adopted and not aware of family medical history.  Prior to Admission medications   Medication Sig Start Date End Date Taking? Authorizing Provider  butalbital-acetaminophen-caffeine (FIORICET) 50-325-40 MG per tablet Take 1-2 tablets by mouth every 6 (six) hours as needed for headache. 04/16/13 04/16/14 Yes Adam Phenix, MD  pantoprazole (PROTONIX) 40 MG tablet Take 1 tablet (40 mg total) by mouth daily. 04/30/13  Yes Napoleon Form, MD  Prenatal Vit-Fe Fumarate-FA (PRENATAL MULTIVITAMIN) TABS Take 1 tablet by mouth daily at 12 noon. 04/16/13  Yes Adam Phenix, MD  zolpidem (AMBIEN) 5 MG tablet Take 5 mg by mouth at bedtime as needed for sleep.   Yes Historical Provider, MD   Past Medical History  Diagnosis Date  . Medical history non-contributory    Past Surgical History  Procedure Laterality Date  . Cesarean section    . Tonsillectomy     Family History  Problem Relation Age of Onset  . Asthma Neg Hx   . Diabetes Neg Hx   . Cancer Neg Hx   . COPD Neg Hx   . Hearing loss Neg Hx   . Heart disease Neg Hx   . Hypertension Neg Hx   . Miscarriages / Stillbirths Neg Hx   . Stroke Neg Hx    History   Social History  . Marital Status: Legally  Separated    Spouse Name: N/A    Number of Children: N/A  . Years of Education: N/A   Occupational History  . Not on file.   Social History Main Topics  . Smoking status: Former Smoker -- 1.00 packs/day for 2 years    Types: Cigarettes    Quit date: 12/09/1994  . Smokeless tobacco: Never Used  . Alcohol Use: No  . Drug Use: No  . Sexually Active: Not Currently    Birth Control/ Protection: None   Other Topics Concern  . Not on file   Social History Narrative  . No narrative on file    Review of Systems  Constitutional: Negative for fever and unexpected weight change.  HENT: Negative for ear pain, nosebleeds, congestion, sore throat, rhinorrhea, sneezing, trouble swallowing, dental problem, postnasal drip and sinus pressure.   Eyes: Negative for redness and itching.  Respiratory: Negative for cough, chest tightness, shortness of breath and wheezing.   Cardiovascular: Negative for palpitations and leg swelling.  Gastrointestinal: Negative for nausea and vomiting.  Genitourinary: Negative for dysuria.  Musculoskeletal: Negative for joint swelling.  Skin: Negative for rash.  Neurological: Negative for headaches.  Hematological: Does not bruise/bleed easily.  Psychiatric/Behavioral: Negative for dysphoric mood. The patient is not nervous/anxious.       Objective:   Physical Exam OBJ- Physical Exam General- Alert,  Oriented, Affect-appropriate, Distress- none acute. Obviously pregnant Skin- rash-none, lesions- none, excoriation- none Lymphadenopathy- none Head- atraumatic            Eyes- Gross vision intact, PERRLA, conjunctivae and secretions clear            Ears- Hearing, canals-normal            Nose- Clear, no-Septal dev, mucus, polyps, erosion, perforation             Throat- Mallampati III , mucosa clear , drainage- none, tonsils- atrophic Neck- flexible , trachea midline, no stridor , thyroid nl, carotid no bruit Chest - symmetrical excursion , unlabored            Heart/CV- RRR , no murmur , no gallop  , no rub, nl s1 s2                           - JVD- none , edema- none, stasis changes- none, varices- none           Lung- clear to P&A, wheeze- none, cough- none , dullness-none, rub- none           Chest wall-  Abd- pregnant Br/ Gen/ Rectal- Not done, not indicated Extrem- cyanosis- none, clubbing, none, atrophy- none, strength- nl Neuro- grossly intact to observation         Assessment & Plan:

## 2013-05-22 ENCOUNTER — Telehealth: Payer: Self-pay | Admitting: *Deleted

## 2013-05-22 ENCOUNTER — Institutional Professional Consult (permissible substitution): Payer: Medicaid Other | Admitting: Pulmonary Disease

## 2013-05-22 ENCOUNTER — Other Ambulatory Visit: Payer: Medicaid Other

## 2013-05-22 NOTE — Telephone Encounter (Signed)
Pt left message stating she ahs been calling the appt line since last week and cannot get through. She needs to reschedule her 3hr GTT and also needs prenatal visit appt.  3hr GTT rescheduled to today- will get Ob fu appt while she is here.

## 2013-05-23 ENCOUNTER — Ambulatory Visit (INDEPENDENT_AMBULATORY_CARE_PROVIDER_SITE_OTHER): Payer: Medicaid Other | Admitting: Obstetrics and Gynecology

## 2013-05-23 ENCOUNTER — Encounter: Payer: Self-pay | Admitting: Obstetrics and Gynecology

## 2013-05-23 ENCOUNTER — Encounter (HOSPITAL_BASED_OUTPATIENT_CLINIC_OR_DEPARTMENT_OTHER): Payer: Medicaid Other

## 2013-05-23 VITALS — BP 103/75 | Temp 97.5°F | Wt 188.1 lb

## 2013-05-23 DIAGNOSIS — O99810 Abnormal glucose complicating pregnancy: Secondary | ICD-10-CM

## 2013-05-23 DIAGNOSIS — O0930 Supervision of pregnancy with insufficient antenatal care, unspecified trimester: Secondary | ICD-10-CM

## 2013-05-23 DIAGNOSIS — O09529 Supervision of elderly multigravida, unspecified trimester: Secondary | ICD-10-CM

## 2013-05-23 DIAGNOSIS — O34219 Maternal care for unspecified type scar from previous cesarean delivery: Secondary | ICD-10-CM

## 2013-05-23 DIAGNOSIS — O09523 Supervision of elderly multigravida, third trimester: Secondary | ICD-10-CM

## 2013-05-23 DIAGNOSIS — O24419 Gestational diabetes mellitus in pregnancy, unspecified control: Secondary | ICD-10-CM

## 2013-05-23 DIAGNOSIS — O0933 Supervision of pregnancy with insufficient antenatal care, third trimester: Secondary | ICD-10-CM

## 2013-05-23 LAB — POCT URINALYSIS DIP (DEVICE)
Bilirubin Urine: NEGATIVE
Hgb urine dipstick: NEGATIVE
Ketones, ur: NEGATIVE mg/dL
Specific Gravity, Urine: 1.015 (ref 1.005–1.030)
pH: 7 (ref 5.0–8.0)

## 2013-05-23 LAB — GLUCOSE TOLERANCE, 3 HOURS
Glucose Tolerance, 1 hour: 171 mg/dL (ref 70–189)
Glucose Tolerance, Fasting: 101 mg/dL (ref 70–104)
Glucose, GTT - 3 Hour: 90 mg/dL (ref 70–144)

## 2013-05-23 NOTE — Progress Notes (Signed)
Evaluation and management procedures were performed by Resident physician under my supervision/collaboration. Chart reviewed, patient examined by me and I agree with management and plan. DNKAs and no F/U on abnl 3hr of 05/22/13. Start CHO restricted diet and return Monday. LBP discussed. BTL consent.

## 2013-05-23 NOTE — Progress Notes (Signed)
No concerns. Reports infrequent irregular contractions, denies any vaginal bleeding / fluid leakage. +Fetal movement daily. Previously failed 1hr OGT, 3 OGT results (2/4 elevated) considered GDM. Reports previous pregnancies required 3hr OGT as well. No hx of DM, or prior GDM. Follows with high-risk clinic normally. Next apt will meet with Dietician and try to control blood sugar with diet, and depending on sugar readings can consider starting insulin if needed. Too be scheduled for C-section and BTL Today routine pelvic and screen for GBS, GC/Chlam

## 2013-05-23 NOTE — Assessment & Plan Note (Signed)
See prenatal vitals and notes for details.

## 2013-05-23 NOTE — Progress Notes (Signed)
Pulse- 107 Patient reports vaginal pressure and lower back pain

## 2013-05-23 NOTE — Patient Instructions (Signed)
Diets for Diabetes, Food Labeling Look at food labels to help you decide how much of a product you can eat. You will want to check the amount of total carbohydrate in a serving to see how the food fits into your meal plan. In the list of ingredients, the ingredient present in the largest amount by weight must be listed first, followed by the other ingredients in descending order. STANDARD OF IDENTITY Most products have a list of ingredients. However, foods that the Food and Drug Administration (FDA) has given a standard of identity do not need a list of ingredients. A standard of identity means that a food must contain certain ingredients if it is called a particular name. Examples are mayonnaise, peanut butter, ketchup, jelly, and cheese. LABELING TERMS There are many terms found on food labels. Some of these terms have specific definitions. Some terms are regulated by the FDA, and the FDA has clearly specified how they can be used. Others are not regulated or well-defined and can be misleading and confusing. SPECIFICALLY DEFINED TERMS Nutritive Sweetener.  A sweetener that contains calories,such as table sugar or honey. Nonnutritive Sweetener.  A sweetener with few or no calories,such as saccharin, aspartame, sucralose, and cyclamate. LABELING TERMS REGULATED BY THE FDA Free.  The product contains only a tiny or small amount of fat, cholesterol, sodium, sugar, or calories. For example, a "fat-free" product will contain less than 0.5 g of fat per serving. Low.  A food described as "low" in fat, saturated fat, cholesterol, sodium, or calories could be eaten fairly often without exceeding dietary guidelines. For example, "low in fat" means no more than 3 g of fat per serving. Lean.  "Lean" and "extra lean" are U.S. Department of Agriculture (USDA) terms for use on meat and poultry products. "Lean" means the product contains less than 10 g of fat, 4 g of saturated fat, and 95 mg of cholesterol  per serving. "Lean" is not as low in fat as a product labeled "low." Extra Lean.  "Extra lean" means the product contains less than 5 g of fat, 2 g of saturated fat, and 95 mg of cholesterol per serving. While "extra lean" has less fat than "lean," it is still higher in fat than a product labeled "low." Reduced, Less, Fewer.  A diet product that contains 25% less of a nutrient or calories than the regular version. For example, hot dogs might be labeled "25% less fat than our regular hot dogs." Light/Lite.  A diet product that contains  fewer calories or  the fat of the original. For example, "light in sodium" means a product with  the usual sodium. More.  One serving contains at least 10% more of the daily value of a vitamin, mineral, or fiber than usual. Good Source Of.  One serving contains 10% to 19% of the daily value for a particular vitamin, mineral, or fiber. Excellent Source Of.  One serving contains 20% or more of the daily value for a particular nutrient. Other terms used might be "high in" or "rich in." Enriched or Fortified.  The product contains added vitamins, minerals, or protein. Nutrition labeling must be used on enriched or fortified foods. Imitation.  The product has been altered so that it is lower in protein, vitamins, or minerals than the usual food,such as imitation peanut butter. Total Fat.  The number listed is the total of all fat found in a serving of the product. Under total fat, food labels must list saturated fat and   trans fat, which are associated with raising bad cholesterol and an increased risk of heart blood vessel disease. Saturated Fat.  Mainly fats from animal-based sources. Some examples are red meat, cheese, cream, whole milk, and coconut oil. Trans Fat.  Found in some fried snack foods, packaged foods, and fried restaurant foods. It is recommended you eat as close to 0 g of trans fat as possible, since it raises bad cholesterol and lowers  good cholesterol. Polyunsaturated and Monounsaturated Fats.  More healthful fats. These fats are from plant sources. Total Carbohydrate.  The number of carbohydrate grams in a serving of the product. Under total carbohydrate are listed the other carbohydrate sources, such as dietary fiber and sugars. Dietary Fiber.  A carbohydrate from plant sources. Sugars.  Sugars listed on the label contain all naturally occurring sugars as well as added sugars. LABELING TERMS NOT REGULATED BY THE FDA Sugarless.  Table sugar (sucrose) has not been added. However, the manufacturer may use another form of sugar in place of sucrose to sweeten the product. For example, sugar alcohols are used to sweeten foods. Sugar alcohols are a form of sugar but are not table sugar. If a product contains sugar alcohols in place of sucrose, it can still be labeled "sugarless." Low Salt, Salt-Free, Unsalted, No Salt, No Salt Added, Without Added Salt.  Food that is usually processed with salt has been made without salt. However, the food may contain sodium-containing additives, such as preservatives, leavening agents, or flavorings. Natural.  This term has no legal meaning. Organic.  Foods that are certified as organic have been inspected and approved by the USDA to ensure they are produced without pesticides, fertilizers containing synthetic ingredients, bioengineering, or ionizing radiation. Document Released: 10/28/2003 Document Revised: 01/17/2012 Document Reviewed: 05/15/2009 [employer] Patient Information 2014 [employer].  

## 2013-05-24 LAB — DRUG SCREEN, URINE
Amphetamine Screen, Ur: NEGATIVE
Benzodiazepines.: POSITIVE — AB
Marijuana Metabolite: NEGATIVE
Phencyclidine (PCP): NEGATIVE

## 2013-05-27 NOTE — Assessment & Plan Note (Signed)
She was titrated to 9 CWP during her sleep study. However she indicates she is comfortable now with her autotitration. Since there is limited time before her delivery, we decided it was reasonable not to make changes. By her description she is getting good control and she is much more comfortable, wearing CPAP. We discussed medical concerns of untreated sleep apnea. It is unclear whether she will have residual problems after childbirth, requiring long-term use of CPAP. I will be happy to see her again as needed.

## 2013-05-28 ENCOUNTER — Encounter: Payer: Medicaid Other | Attending: Family Medicine | Admitting: *Deleted

## 2013-05-28 ENCOUNTER — Ambulatory Visit (INDEPENDENT_AMBULATORY_CARE_PROVIDER_SITE_OTHER): Payer: Medicaid Other | Admitting: Family Medicine

## 2013-05-28 VITALS — BP 106/75 | Temp 98.1°F | Wt 189.0 lb

## 2013-05-28 DIAGNOSIS — O24419 Gestational diabetes mellitus in pregnancy, unspecified control: Secondary | ICD-10-CM

## 2013-05-28 DIAGNOSIS — O99810 Abnormal glucose complicating pregnancy: Secondary | ICD-10-CM

## 2013-05-28 DIAGNOSIS — O09529 Supervision of elderly multigravida, unspecified trimester: Secondary | ICD-10-CM

## 2013-05-28 DIAGNOSIS — O34219 Maternal care for unspecified type scar from previous cesarean delivery: Secondary | ICD-10-CM

## 2013-05-28 DIAGNOSIS — Z713 Dietary counseling and surveillance: Secondary | ICD-10-CM | POA: Insufficient documentation

## 2013-05-28 DIAGNOSIS — O09523 Supervision of elderly multigravida, third trimester: Secondary | ICD-10-CM

## 2013-05-28 LAB — POCT URINALYSIS DIP (DEVICE)
Bilirubin Urine: NEGATIVE
Hgb urine dipstick: NEGATIVE
Ketones, ur: NEGATIVE mg/dL
pH: 6 (ref 5.0–8.0)

## 2013-05-28 NOTE — Progress Notes (Signed)
Nutrition Note: Diagnosed with GDM today at [redacted] weeks gestation.  Wt. Gain of 59# much greater than expected.   Discussed nutrition for GDM and importance of regular meals and snacks.  Pt. Reports husband has Type II so is familiar with diet plan. F/U as needed. Candice C. Earlene Plater, MPH, RD, LDN

## 2013-05-28 NOTE — Progress Notes (Signed)
+  FM, no LOF, No VB, INtermmittent ctx, mild Pt without glucose in urine. Doing well otherwise. Not checking sugars at home. Plan for Repeat LTCS @ 39 wks. Has conflict on Monday, and will schedule for Tuesday. Will send to dietician to discuss diabetic diet with glucose monitoring.

## 2013-05-28 NOTE — Patient Instructions (Addendum)
Gestational Diabetes Mellitus Gestational diabetes mellitus, often simply referred to as gestational diabetes, is a type of diabetes that some women develop during pregnancy. In gestational diabetes, the pancreas does not make enough insulin (a hormone), the cells are less responsive to the insulin that is made (insulin resistance), or both.Normally, insulin moves sugars from food into the tissue cells. The tissue cells use the sugars for energy. The lack of insulin or the lack of normal response to insulin causes excess sugars to build up in the blood instead of going into the tissue cells. As a result, high blood sugar (hyperglycemia) develops. The effect of high sugar (glucose) levels can cause many complications.  RISK FACTORS You have an increased chance of developing gestational diabetes if you have a family history of diabetes and also have one or more of the following risk factors:  A body mass index over 30 (obesity).  A previous pregnancy with gestational diabetes.  An older age at the time of pregnancy. If blood glucose levels are kept in the normal range during pregnancy, women can have a healthy pregnancy. If your blood glucose levels are not well controlled, there may be risks to you, your unborn baby (fetus), your labor and delivery, or your newborn baby.  SYMPTOMS  If symptoms are experienced, they are much like symptoms you would normally expect during pregnancy. The symptoms of gestational diabetes include:   Increased thirst (polydipsia).  Increased urination (polyuria).  Increased urination during the night (nocturia).  Weight loss. This weight loss may be rapid.  Frequent, recurring infections.  Tiredness (fatigue).  Weakness.  Vision changes, such as blurred vision.  Fruity smell to your breath.  Abdominal pain. DIAGNOSIS Diabetes is diagnosed when blood glucose levels are increased. Your blood glucose level may be checked by one or more of the following blood  tests:  A fasting blood glucose test. You will not be allowed to eat for at least 8 hours before a blood sample is taken.  A random blood glucose test. Your blood glucose is checked at any time of the day regardless of when you ate.  A hemoglobin A1c blood glucose test. A hemoglobin A1c test provides information about blood glucose control over the previous 3 months.  An oral glucose tolerance test (OGTT). Your blood glucose is measured after you have not eaten (fasted) for 1 3 hours and then after you drink a glucose-containing beverage. Since the hormones that cause insulin resistance are highest at about 24 28 weeks of a pregnancy, an OGTT is usually performed during that time. If you have risk factors for gestational diabetes, your caregiver may test you for gestational diabetes earlier than 24 weeks of pregnancy. TREATMENT   You will need to take diabetes medicine or insulin daily to keep blood glucose levels in the desired range.  You will need to match insulin dosing with exercise and healthy food choices. The treatment goal is to maintain the before meal (preprandial), bedtime, and overnight blood glucose level at 60 99 mg/dL during pregnancy. The treatment goal is to further maintain peak after meal blood sugar (postprandial glucose) level at 100 140 mg/dL.  HOME CARE INSTRUCTIONS   Have your hemoglobin A1c level checked twice a year.  Perform daily blood glucose monitoring as directed by your caregiver. It is common to perform frequent blood glucose monitoring.  Monitor urine ketones when you are ill and as directed by your caregiver.  Take your diabetes medicine and insulin as directed by your caregiver to  maintain your blood glucose level in the desired range.  Never run out of diabetes medicine or insulin. It is needed every day.  Adjust insulin based on your intake of carbohydrates. Carbohydrates can raise blood glucose levels but need to be included in your diet.  Carbohydrates provide vitamins, minerals, and fiber which are an essential part of a healthy diet. Carbohydrates are found in fruits, vegetables, whole grains, dairy products, legumes, and foods containing added sugars.    Eat healthy foods. Alternate 3 meals with 3 snacks.  Maintain a healthy weight gain. The usual total expected weight gain varies according to your prepregnancy body mass index (BMI).  Carry a medical alert card or wear your medical alert jewelry.  Carry a 15 gram carbohydrate snack with you at all times to treat low blood glucose (hypoglycemia). Some examples of 15 gram carbohydrate snacks include:  Glucose tablets, 3 or 4   Glucose gel, 15 gram tube  Raisins, 2 tablespoons (24 g)  Jelly beans, 6  Animal crackers, 8  Fruit juice, regular soda, or low fat milk, 4 ounces (120 mL)  Gummy treats, 9    Recognize hypoglycemia. Hypoglycemia during pregnancy occurs with blood glucose levels of 60 mg/dL and below. The risk for hypoglycemia increases when fasting or skipping meals, during or after intense exercise, and during sleep. Hypoglycemia symptoms can include:  Tremors or shakes.  Decreased ability to concentrate.  Sweating.  Increased heart rate.  Headache.  Dry mouth.  Hunger.  Irritability.  Anxiety.  Restless sleep.  Altered speech or coordination.  Confusion.  Treat hypoglycemia promptly. If you are alert and able to safely swallow, follow the 15:15 rule:  Take 15 20 grams of rapid-acting glucose or carbohydrate. Rapid-acting options include glucose gel, glucose tablets, or 4 ounces (120 mL) of fruit juice, regular soda, or low fat milk.  Check your blood glucose level 15 minutes after taking the glucose.   Take 15 20 grams more of glucose if the repeat blood glucose level is still 70 mg/dL or below.  Eat a meal or snack within 1 hour once blood glucose levels return to normal.  Be alert to polyuria and polydipsia which are early  signs of hyperglycemia. An early awareness of hyperglycemia allows for prompt treatment. Treat hyperglycemia as directed by your caregiver.  Engage in at least 30 minutes of physical activity a day or as directed by your caregiver. Ten minutes of physical activity timed 30 minutes after each meal is encouraged to control postprandial blood glucose levels.  Adjust your insulin dosing and food intake as needed if you start a new exercise or sport.  Follow your sick day plan at any time you are unable to eat or drink as usual.  Avoid tobacco and alcohol use.  Follow up with your caregiver regularly.  Follow the advice of your caregiver regarding your prenatal and post-delivery (postpartum) appointments, meal planning, exercise, medicines, vitamins, blood tests, other medical tests, and physical activities.  Perform daily skin and foot care. Examine your skin and feet daily for cuts, bruises, redness, nail problems, bleeding, blisters, or sores.  Brush your teeth and gums at least twice a day and floss at least once a day. Follow up with your dentist regularly.  Schedule an eye exam during the first trimester of your pregnancy or as directed by your caregiver.  Share your diabetes management plan with your workplace or school.  Stay up-to-date with immunizations.  Learn to manage stress.  Obtain ongoing diabetes education   and support as needed. SEEK MEDICAL CARE IF:   You are unable to eat food or drink fluids for more than 6 hours.  You have nausea and vomiting for more than 6 hours.  You have a blood glucose level of 200 mg/dL and you have ketones in your urine.  There is a change in mental status.  You develop vision problems.  You have a persistent headache.  You have upper abdominal pain or discomfort.  You develop an additional serious illness.  You have diarrhea for more than 6 hours.  You have been sick or have had a fever for a couple of days and are not getting  better. SEEK IMMEDIATE MEDICAL CARE IF:   You have difficulty breathing.  You no longer feel the baby moving.  You are bleeding or have discharge from your vagina.  You start having premature contractions or labor. MAKE SURE YOU:  Understand these instructions.  Will watch your condition.  Will get help right away if you are not doing well or get worse. Document Released: 01/31/2001 Document Revised: 07/19/2012 Document Reviewed: 05/23/2012 [facility] Patient Information 2014 [place], [place].   Pregnancy - Third Trimester The third trimester of pregnancy (the last 3 months) is a period of the most rapid growth for you and your baby. The baby approaches a length of 20 inches and a weight of 6 to 10 pounds. The baby is adding on fat and getting ready for life outside your body. While inside, babies have periods of sleeping and waking, sucking thumbs, and hiccuping. You can often feel small contractions of the uterus. This is false labor. It is also called Braxton-Hicks contractions. This is like a practice for labor. The usual problems in this stage of pregnancy include more difficulty breathing, swelling of the hands and feet from water retention, and having to urinate more often because of the uterus and baby pressing on your bladder.  PRENATAL EXAMS  Blood work may continue to be done during prenatal exams. These tests are done to check on your health and the probable health of your baby. Blood work is used to follow your blood levels (hemoglobin). Anemia (low hemoglobin) is common during pregnancy. Iron and vitamins are given to help prevent this. You may also continue to be checked for diabetes. Some of the past blood tests may be done again.  The size of the uterus is measured during each visit. This makes sure your baby is growing properly according to your pregnancy dates.  Your blood pressure is checked every prenatal visit. This is to make sure you are not getting toxemia.  Your  urine is checked every prenatal visit for infection, diabetes, and protein.  Your weight is checked at each visit. This is done to make sure gains are happening at the suggested rate and that you and your baby are growing normally.  Sometimes, an ultrasound is performed to confirm the position and the proper growth and development of the baby. This is a test done that bounces harmless sound waves off the baby so your caregiver can more accurately determine a due date.  Discuss the type of pain medicine and anesthesia you will have during your labor and delivery.  Discuss the possibility and anesthesia if a cesarean section might be necessary.  Inform your caregiver if there is any mental or physical violence at home. Sometimes, a specialized non-stress test, contraction stress test, and biophysical profile are done to make sure the baby is not having a problem.  Checking the amniotic fluid surrounding the baby is called an amniocentesis. The amniotic fluid is removed by sticking a needle into the belly (abdomen). This is sometimes done near the end of pregnancy if an early delivery is required. In this case, it is done to help make sure the baby's lungs are mature enough for the baby to live outside of the womb. If the lungs are not mature and it is unsafe to deliver the baby, an injection of cortisone medicine is given to the mother 1 to 2 days before the delivery. This helps the baby's lungs mature and makes it safer to deliver the baby. CHANGES OCCURING IN THE THIRD TRIMESTER OF PREGNANCY Your body goes through many changes during pregnancy. They vary from person to person. Talk to your caregiver about changes you notice and are concerned about.  During the last trimester, you have probably had an increase in your appetite. It is normal to have cravings for certain foods. This varies from person to person and pregnancy to pregnancy.  You may begin to get stretch marks on your hips, abdomen, and  breasts. These are normal changes in the body during pregnancy. There are no exercises or medicines to take which prevent this change.  Constipation may be treated with a stool softener or adding bulk to your diet. Drinking lots of fluids, fiber in vegetables, fruits, and whole grains are helpful.  Exercising is also helpful. If you have been very active up until your pregnancy, most of these activities can be continued during your pregnancy. If you have been less active, it is helpful to start an exercise program such as walking. Consult your caregiver before starting exercise programs.  Avoid all smoking, alcohol, non-prescribed drugs, herbs and "street drugs" during your pregnancy. These chemicals affect the formation and growth of the baby. Avoid chemicals throughout the pregnancy to ensure the delivery of a healthy infant.  Backache, varicose veins, and hemorrhoids may develop or get worse.  You will tire more easily in the third trimester, which is normal.  The baby's movements may be stronger and more often.  You may become short of breath easily.  Your belly button may stick out.  A yellow discharge may leak from your breasts called colostrum.  You may have a bloody mucus discharge. This usually occurs a few days to a week before labor begins. HOME CARE INSTRUCTIONS   Keep your caregiver's appointments. Follow your caregiver's instructions regarding medicine use, exercise, and diet.  During pregnancy, you are providing food for you and your baby. Continue to eat regular, well-balanced meals. Choose foods such as meat, fish, milk and other low fat dairy products, vegetables, fruits, and whole-grain breads and cereals. Your caregiver will tell you of the ideal weight gain.  A physical sexual relationship may be continued throughout pregnancy if there are no other problems such as early (premature) leaking of amniotic fluid from the membranes, vaginal bleeding, or belly (abdominal)  pain.  Exercise regularly if there are no restrictions. Check with your caregiver if you are unsure of the safety of your exercises. Greater weight gain will occur in the last 2 trimesters of pregnancy. Exercising helps:  Control your weight.  Get you in shape for labor and delivery.  You lose weight after you deliver.  Rest a lot with legs elevated, or as needed for leg cramps or low back pain.  Wear a good support or jogging bra for breast tenderness during pregnancy. This may help if worn during sleep.  Pads or tissues may be used in the bra if you are leaking colostrum.  Do not use hot tubs, steam rooms, or saunas.  Wear your seat belt when driving. This protects you and your baby if you are in an accident.  Avoid raw meat, cat litter boxes and soil used by cats. These carry germs that can cause birth defects in the baby.  It is easier to leak urine during pregnancy. Tightening up and strengthening the pelvic muscles will help with this problem. You can practice stopping your urination while you are going to the bathroom. These are the same muscles you need to strengthen. It is also the muscles you would use if you were trying to stop from passing gas. You can practice tightening these muscles up 10 times a set and repeating this about 3 times per day. Once you know what muscles to tighten up, do not perform these exercises during urination. It is more likely to cause an infection by backing up the urine.  Ask for help if you have financial, counseling, or nutritional needs during pregnancy. Your caregiver will be able to offer counseling for these needs as well as refer you for other special needs.  Make a list of emergency phone numbers and have them available.  Plan on getting help from family or friends when you go home from the hospital.  Make a trial run to the hospital.  Take prenatal classes with the father to understand, practice, and ask questions about the labor and  delivery.  Prepare the baby's room or nursery.  Do not travel out of the city unless it is absolutely necessary and with the advice of your caregiver.  Wear only low or no heal shoes to have better balance and prevent falling. MEDICINES AND DRUG USE IN PREGNANCY  Take prenatal vitamins as directed. The vitamin should contain 1 milligram of folic acid. Keep all vitamins out of reach of children. Only a couple vitamins or tablets containing iron may be fatal to a baby or [place] child when ingested.  Avoid use of all medicines, including herbs, over-the-counter medicines, not prescribed or suggested by your caregiver. Only take over-the-counter or prescription medicines for pain, discomfort, or fever as directed by your caregiver. Do not use aspirin, ibuprofen or naproxen unless approved by your caregiver.  Let your caregiver also know about herbs you may be using.  Alcohol is related to a number of birth defects. This includes fetal alcohol syndrome. All alcohol, in any form, should be avoided completely. Smoking will cause low birth rate and premature babies.  Illegal drugs are very harmful to the baby. They are absolutely forbidden. A baby born to an addicted mother will be addicted at birth. The baby will go through the same withdrawal an adult does. SEEK MEDICAL CARE IF: You have any concerns or worries during your pregnancy. It is better to call with your questions if you feel they cannot wait, rather than worry about them. SEEK IMMEDIATE MEDICAL CARE IF:   An unexplained oral temperature above 102 F (38.9 C) develops, or as your caregiver suggests.  You have leaking of fluid from the vagina. If leaking membranes are suspected, take your temperature and tell your caregiver of this when you call.  There is vaginal spotting, bleeding or passing clots. Tell your caregiver of the amount and how many pads are used.  You develop a bad smelling vaginal discharge with a change in the color  from clear to white.  You  develop vomiting that lasts more than 24 hours.  You develop chills or fever.  You develop shortness of breath.  You develop burning on urination.  You loose more than 2 pounds of weight or gain more than 2 pounds of weight or as suggested by your caregiver.  You notice sudden swelling of your face, hands, and feet or legs.  You develop belly (abdominal) pain. Round ligament discomfort is a common non-cancerous (benign) cause of abdominal pain in pregnancy. Your caregiver still must evaluate you.  You develop a severe headache that does not go away.  You develop visual problems, blurred or double vision.  If you have not felt your baby move for more than 1 hour. If you think the baby is not moving as much as usual, eat something with sugar in it and lie down on your left side for an hour. The baby should move at least 4 to 5 times per hour. Call right away if your baby moves less than that.  You fall, are in a car accident, or any kind of trauma.  There is mental or physical violence at home. Document Released: 10/19/2001 Document Revised: 07/19/2012 Document Reviewed: 04/23/2009 [facility] Patient Information 2014 [place], [place].

## 2013-05-28 NOTE — Progress Notes (Signed)
Pt in for first visit with GDM dx.  Pt needed meter, and knows somewhat how to do because her mother also checks her cbg's. Pt given OneTouch Nano and given instruction. Pt returned demonstration. Pt has seen RD. Pt given cbg goal range for fasting and 2 hr.pp. Thank you, Lenor Coffin, RN, Diabetes CNS [phone])

## 2013-05-28 NOTE — Progress Notes (Signed)
Pulse 124  Edema trace in face, hands, and feet.

## 2013-05-30 ENCOUNTER — Telehealth: Payer: Self-pay | Admitting: *Deleted

## 2013-05-30 MED ORDER — ACCU-CHEK FASTCLIX LANCETS MISC: 1.0000 | Freq: Four times a day (QID) | Status: DC

## 2013-05-30 MED ORDER — GLUCOSE BLOOD VI STRP: ORAL_STRIP | Status: DC

## 2013-05-30 NOTE — Telephone Encounter (Signed)
Called pt and informed her that she can go pick up lancets/strips from her pharmacy.  Pt stated understanding and did not have any other questions.

## 2013-05-30 NOTE — Telephone Encounter (Signed)
[name] called and left a message she was sent home with a meter Monday because of GDM but only has 5 needles , states was supposed to have 10 she thinks -States she will run out of testing supplies at end of day.  states has a accucheck nano- and she called pharmacy and needs prescription for needles and strips called in - requests a call back. Marland Kitchen

## 2013-06-02 MED ORDER — MEPERIDINE HCL 25 MG/ML IJ SOLN
INTRAMUSCULAR | Status: AC
  Filled 2013-06-02: qty 1

## 2013-06-04 ENCOUNTER — Encounter: Payer: Self-pay | Admitting: Obstetrics and Gynecology

## 2013-06-04 ENCOUNTER — Ambulatory Visit (INDEPENDENT_AMBULATORY_CARE_PROVIDER_SITE_OTHER): Payer: Medicaid Other | Admitting: Obstetrics and Gynecology

## 2013-06-04 VITALS — BP 111/72 | Wt 193.4 lb

## 2013-06-04 DIAGNOSIS — O0933 Supervision of pregnancy with insufficient antenatal care, third trimester: Secondary | ICD-10-CM

## 2013-06-04 DIAGNOSIS — O09523 Supervision of elderly multigravida, third trimester: Secondary | ICD-10-CM

## 2013-06-04 DIAGNOSIS — O34219 Maternal care for unspecified type scar from previous cesarean delivery: Secondary | ICD-10-CM

## 2013-06-04 DIAGNOSIS — O0930 Supervision of pregnancy with insufficient antenatal care, unspecified trimester: Secondary | ICD-10-CM

## 2013-06-04 DIAGNOSIS — O09529 Supervision of elderly multigravida, unspecified trimester: Secondary | ICD-10-CM

## 2013-06-04 DIAGNOSIS — O24419 Gestational diabetes mellitus in pregnancy, unspecified control: Secondary | ICD-10-CM

## 2013-06-04 DIAGNOSIS — O99810 Abnormal glucose complicating pregnancy: Secondary | ICD-10-CM

## 2013-06-04 DIAGNOSIS — G4733 Obstructive sleep apnea (adult) (pediatric): Secondary | ICD-10-CM

## 2013-06-04 DIAGNOSIS — Z23 Encounter for immunization: Secondary | ICD-10-CM

## 2013-06-04 LAB — POCT URINALYSIS DIP (DEVICE)
Ketones, ur: NEGATIVE mg/dL
Protein, ur: NEGATIVE mg/dL
Specific Gravity, Urine: 1.02 (ref 1.005–1.030)
pH: 7 (ref 5.0–8.0)

## 2013-06-04 MED ORDER — TETANUS-DIPHTH-ACELL PERTUSSIS 5-2.5-18.5 LF-MCG/0.5 IM SUSP: 0.5000 mL | Freq: Once | INTRAMUSCULAR | Status: DC

## 2013-06-04 NOTE — Progress Notes (Signed)
P=104,  c/o increased edema in legs/feet/hands/face, Desires tdap today.

## 2013-06-04 NOTE — Progress Notes (Signed)
Patient doing well without complaints. CBG's all within range with the exception of fasting values. Some fasting elevated but patient states that they are not true fasting values as she often eats in the middle of the night. Advised patient to consume a bedtime snacks to avoid nocturnal eating. Patient scheduled for c-section on 8/5. FM/labor precautions reviewed.

## 2013-06-04 NOTE — Addendum Note (Signed)
Addended by: Gerome Apley on: 06/04/2013 12:30 PM   Modules accepted: Orders

## 2013-06-06 ENCOUNTER — Encounter: Payer: Medicaid Other | Admitting: Family

## 2013-06-08 ENCOUNTER — Inpatient Hospital Stay (HOSPITAL_COMMUNITY): Admission: RE | Admit: 2013-06-08 | Payer: Medicaid Other | Source: Ambulatory Visit

## 2013-06-08 ENCOUNTER — Inpatient Hospital Stay (HOSPITAL_COMMUNITY)
Admission: AD | Admit: 2013-06-08 | Discharge: 2013-06-08 | Disposition: A | Discharge status: Home / Self Care | Payer: Medicaid Other | Source: Ambulatory Visit | Attending: Obstetrics & Gynecology | Admitting: Obstetrics & Gynecology

## 2013-06-08 ENCOUNTER — Encounter: Payer: Self-pay | Admitting: *Deleted

## 2013-06-08 ENCOUNTER — Encounter (HOSPITAL_COMMUNITY): Payer: Self-pay | Admitting: *Deleted

## 2013-06-08 DIAGNOSIS — O479 False labor, unspecified: Secondary | ICD-10-CM | POA: Insufficient documentation

## 2013-06-08 DIAGNOSIS — O34219 Maternal care for unspecified type scar from previous cesarean delivery: Secondary | ICD-10-CM | POA: Insufficient documentation

## 2013-06-08 LAB — URINE MICROSCOPIC-ADD ON

## 2013-06-08 LAB — URINALYSIS, ROUTINE W REFLEX MICROSCOPIC
Glucose, UA: NEGATIVE mg/dL
Ketones, ur: NEGATIVE mg/dL
pH: 6.5 (ref 5.0–8.0)

## 2013-06-08 MED ORDER — LACTATED RINGERS IV BOLUS (SEPSIS)
1000.0000 mL | Freq: Once | INTRAVENOUS | Status: AC
  Administered 2013-06-08: 1000 mL via INTRAVENOUS

## 2013-06-08 NOTE — MAU Note (Signed)
Pt states she has noticed pressure for the past  3-4 days . pts states it does not feel like contractions

## 2013-06-08 NOTE — MAU Provider Note (Signed)
None     Chief Complaint:  Labor Eval   PATSEY PITSTICK is  37 y.o. [phone] at [redacted]w[redacted]d presents complaining of Labor Eval She c/o feeling "uncomfortable" rather than "contracting" with some pressure. For repeat c/s at 39 weeks  Obstetrical/Gynecological History: OB History   Grav Para Term Preterm Abortions TAB SAB Ect Mult Living   4 3 3       3      Past Medical History: Past Medical History  Diagnosis Date  . Medical history non-contributory     Past Surgical History: Past Surgical History  Procedure Laterality Date  . Cesarean section    . Tonsillectomy      Family History: Family History  Problem Relation Age of Onset  . Asthma Neg Hx   . Diabetes Neg Hx   . Cancer Neg Hx   . COPD Neg Hx   . Hearing loss Neg Hx   . Heart disease Neg Hx   . Hypertension Neg Hx   . Miscarriages / Stillbirths Neg Hx   . Stroke Neg Hx     Social History: History  Substance Use Topics  . Smoking status: Former Smoker -- 1.00 packs/day for 2 years    Types: Cigarettes    Quit date: 12/09/1994  . Smokeless tobacco: Never Used  . Alcohol Use: No    Allergies:  Allergies  Allergen Reactions  . Tramadol     Couldn't be awakened    Meds:  No prescriptions prior to admission   Review of Systems   Constitutional: Negative for fever and chills Eyes: Negative for visual disturbances Respiratory: Negative for shortness of breath, dyspnea Cardiovascular: Negative for chest pain or palpitations  Gastrointestinal: Negative for vomiting, diarrhea and constipation.  Feels "uncomfortable" in lower stomach Genitourinary: Negative for dysuria and urgency Musculoskeletal: Negative for back pain, joint pain, myalgias  Neurological: Negative for dizziness and headaches     Physical Exam  Blood pressure 106/56, pulse 88, temperature 98.2 F (36.8 C), temperature source Oral, resp. rate 16, height 5\' 2"  (1.575 m), weight 195 lb (88.451 kg), last menstrual period 09/11/2012, SpO2  99.00%. GENERAL: Well-developed, well-nourished female in no acute distress.  LUNGS: Clear to auscultation bilaterally.  HEART: Regular rate and rhythm. ABDOMEN: Soft, nontender, nondistended, gravid.  EXTREMITIES: Nontender, no edema, 2+ distal pulses. DTR's 2+ CERVICAL EXAM: Dilatation 1-2cm   Effacement thick   Station -3   Presentation: cephalic FHT:  Baseline rate 150 bpm   Variability moderate  Accelerations present   Decelerations none Contractions:  Uterine irritability   Labs: Results for orders placed during the hospital encounter of 06/08/13 (from the past 24 hour(s))  URINALYSIS, ROUTINE W REFLEX MICROSCOPIC   Collection Time    06/08/13  1:23 AM      Result Value Range   Color, Urine YELLOW  YELLOW   APPearance CLEAR  CLEAR   Specific Gravity, Urine 1.020  1.005 - 1.030   pH 6.5  5.0 - 8.0   Glucose, UA NEGATIVE  NEGATIVE mg/dL   Hgb urine dipstick NEGATIVE  NEGATIVE   Bilirubin Urine NEGATIVE  NEGATIVE   Ketones, ur NEGATIVE  NEGATIVE mg/dL   Protein, ur NEGATIVE  NEGATIVE mg/dL   Urobilinogen, UA 0.2  0.0 - 1.0 mg/dL   Nitrite NEGATIVE  NEGATIVE   Leukocytes, UA LARGE (*) NEGATIVE  URINE MICROSCOPIC-ADD ON   Collection Time    06/08/13  1:23 AM      Result Value Range   Squamous  Epithelial / LPF MANY (*) RARE   WBC, UA 0-2  <3 WBC/hpf   RBC / HPF 0-2  <3 RBC/hpf   Urine-Other MUCOUS PRESENT     Imaging Studies:  No results found.  Assessment: ELZA VARRICCHIO is  37 y.o. [phone] at [redacted]w[redacted]d presents with uterine irritability.  Plan: IVF bolus (did not want to do PO fluid in case irritability turned out to be cervical changing contractions and pt is a repeat c/s)>  Greatly minimized uterine activity.  CX rechecked by M. Williams, CNM, and deemed to be u nchanged.  PT d/c'd home  CRESENZO-DISHMAN,Malvina Schadler 8/1/20146:36 PM

## 2013-06-08 NOTE — MAU Note (Signed)
Pelvic pressure

## 2013-06-08 NOTE — Progress Notes (Signed)
Pt states she rates her headache a 3-4

## 2013-06-10 NOTE — MAU Provider Note (Signed)
Attestation of Attending Supervision of Advanced Practitioner (PA/CNM/NP): Evaluation and management procedures were performed by the Advanced Practitioner under my supervision and collaboration.  I have reviewed the Advanced Practitioner's note and chart, and I agree with the management and plan.  Tierria Watson, MD, FACOG Attending Obstetrician & Gynecologist Faculty Practice, [facility]  

## 2013-06-11 ENCOUNTER — Inpatient Hospital Stay (HOSPITAL_COMMUNITY)
Admission: RE | Admit: 2013-06-11 | Payer: Medicaid Other | Source: Ambulatory Visit | Admitting: Obstetrics and Gynecology

## 2013-06-11 ENCOUNTER — Encounter (HOSPITAL_COMMUNITY): Admission: RE | Payer: Self-pay | Source: Ambulatory Visit

## 2013-06-11 ENCOUNTER — Encounter (HOSPITAL_COMMUNITY): Payer: Self-pay

## 2013-06-11 ENCOUNTER — Encounter (HOSPITAL_COMMUNITY)
Admission: RE | Admit: 2013-06-11 | Discharge: 2013-06-11 | Disposition: A | Discharge status: Home / Self Care | Payer: Medicaid Other | Source: Ambulatory Visit | Attending: Obstetrics & Gynecology | Admitting: Obstetrics & Gynecology

## 2013-06-11 ENCOUNTER — Encounter (HOSPITAL_COMMUNITY): Payer: Self-pay | Admitting: Pharmacy Technician

## 2013-06-11 HISTORY — DX: Type 2 diabetes mellitus without complications: E11.9

## 2013-06-11 LAB — BASIC METABOLIC PANEL
GFR calc Af Amer: >90 mL/min (ref >90)
GFR calc non Af Amer: >90 mL/min (ref >90)
Potassium: 4.1 mEq/L (ref 3.5–5.1)
Sodium: 133 mEq/L — ABNORMAL LOW (ref 135–145)

## 2013-06-11 LAB — CBC
Hemoglobin: 12.5 g/dL (ref 12.0–15.0)
MCHC: 34.5 g/dL (ref 30.0–36.0)
Platelets: 244 10*3/uL (ref 150–400)
RDW: 14.1 % (ref 11.5–15.5)

## 2013-06-11 SURGERY — Surgical Case
Anesthesia: Regional | Site: Abdomen | Laterality: Bilateral

## 2013-06-11 NOTE — Patient Instructions (Signed)
Your procedure is scheduled on:06/12/13  Enter through the Main Entrance at :2:15pm Pick up desk phone and dial [phone] and inform us of your arrival.  Please call [phone] if you have any problems the morning of surgery.  Remember: Do not eat food after 3am Clear liquids are ok until:1130 Tues am   You may brush your teeth the morning of surgery.  Take these meds the morning of surgery with a sip of water:PANTOPRAZOLE  DO NOT wear jewelry, eye make-up, lipstick,body lotion, or dark fingernail polish.  (Polished toes are ok) You may wear deodorant.  If you are to be admitted after surgery, leave suitcase in car until your room has been assigned. Patients discharged on the day of surgery will not be allowed to drive home. Wear loose fitting, comfortable clothes for your ride home.

## 2013-06-12 ENCOUNTER — Encounter (HOSPITAL_COMMUNITY): Payer: Self-pay | Admitting: Anesthesiology

## 2013-06-12 ENCOUNTER — Inpatient Hospital Stay (HOSPITAL_COMMUNITY)
Admission: RE | Admit: 2013-06-12 | Discharge: 2013-06-15 | DRG: 765 | Disposition: A | Discharge status: Home / Self Care | Payer: Medicaid Other | Source: Ambulatory Visit | Attending: Obstetrics & Gynecology | Admitting: Obstetrics & Gynecology

## 2013-06-12 ENCOUNTER — Inpatient Hospital Stay (HOSPITAL_COMMUNITY): Payer: Medicaid Other | Admitting: Anesthesiology

## 2013-06-12 ENCOUNTER — Encounter (HOSPITAL_COMMUNITY)
Admission: RE | Disposition: A | Discharge status: Home / Self Care | Payer: Self-pay | Source: Ambulatory Visit | Attending: Obstetrics & Gynecology

## 2013-06-12 DIAGNOSIS — O0933 Supervision of pregnancy with insufficient antenatal care, third trimester: Secondary | ICD-10-CM

## 2013-06-12 DIAGNOSIS — O99814 Abnormal glucose complicating childbirth: Secondary | ICD-10-CM

## 2013-06-12 DIAGNOSIS — K645 Perianal venous thrombosis: Secondary | ICD-10-CM | POA: Diagnosis present

## 2013-06-12 DIAGNOSIS — O09529 Supervision of elderly multigravida, unspecified trimester: Secondary | ICD-10-CM | POA: Diagnosis present

## 2013-06-12 DIAGNOSIS — O34219 Maternal care for unspecified type scar from previous cesarean delivery: Principal | ICD-10-CM | POA: Diagnosis present

## 2013-06-12 DIAGNOSIS — O878 Other venous complications in the puerperium: Secondary | ICD-10-CM

## 2013-06-12 DIAGNOSIS — Z302 Encounter for sterilization: Secondary | ICD-10-CM

## 2013-06-12 DIAGNOSIS — O24419 Gestational diabetes mellitus in pregnancy, unspecified control: Secondary | ICD-10-CM

## 2013-06-12 LAB — RPR: RPR Ser Ql: NONREACTIVE

## 2013-06-12 SURGERY — Surgical Case
Anesthesia: Epidural | Site: Abdomen | Laterality: Bilateral | Wound class: Clean Contaminated

## 2013-06-12 MED ORDER — DIPHENHYDRAMINE HCL 50 MG/ML IJ SOLN: 25.0000 mg | INTRAMUSCULAR | Status: DC | PRN

## 2013-06-12 MED ORDER — DIPHENHYDRAMINE HCL 25 MG PO CAPS: 25.0000 mg | ORAL_CAPSULE | ORAL | Status: DC | PRN

## 2013-06-12 MED ORDER — OXYTOCIN 10 UNIT/ML IJ SOLN
INTRAMUSCULAR | Status: AC
  Filled 2013-06-12: qty 4

## 2013-06-12 MED ORDER — ONDANSETRON HCL 4 MG/2ML IJ SOLN
INTRAMUSCULAR | Status: AC
  Filled 2013-06-12: qty 2

## 2013-06-12 MED ORDER — OXYTOCIN 10 UNIT/ML IJ SOLN
40.0000 [IU] | INTRAVENOUS | Status: DC | PRN
  Administered 2013-06-12: 40 [IU] via INTRAVENOUS

## 2013-06-12 MED ORDER — BUPIVACAINE IN DEXTROSE 0.75-8.25 % IT SOLN
INTRATHECAL | Status: DC | PRN
  Administered 2013-06-12: 1.4 mL via INTRATHECAL

## 2013-06-12 MED ORDER — ACETAMINOPHEN 500 MG PO TABS
1000.0000 mg | ORAL_TABLET | Freq: Four times a day (QID) | ORAL | Status: AC | PRN
  Administered 2013-06-12 – 2013-06-13 (×2): 1000 mg via ORAL
  Filled 2013-06-12 (×2): qty 2

## 2013-06-12 MED ORDER — FENTANYL CITRATE 0.05 MG/ML IJ SOLN
INTRAMUSCULAR | Status: DC | PRN
  Administered 2013-06-12: 15 ug via INTRATHECAL

## 2013-06-12 MED ORDER — ONDANSETRON HCL 4 MG/2ML IJ SOLN: 4.0000 mg | INTRAMUSCULAR | Status: DC | PRN

## 2013-06-12 MED ORDER — PRENATAL MULTIVITAMIN CH
1.0000 | ORAL_TABLET | Freq: Every day | ORAL | Status: DC
  Administered 2013-06-13 – 2013-06-15 (×3): 1 via ORAL
  Filled 2013-06-12 (×3): qty 1

## 2013-06-12 MED ORDER — FENTANYL CITRATE 0.05 MG/ML IJ SOLN
INTRAMUSCULAR | Status: AC
  Administered 2013-06-12: 50 ug via INTRAVENOUS
  Filled 2013-06-12: qty 2

## 2013-06-12 MED ORDER — CEFAZOLIN SODIUM-DEXTROSE 2-3 GM-% IV SOLR
INTRAVENOUS | Status: AC
  Administered 2013-06-12: 2 g via INTRAVENOUS
  Filled 2013-06-12: qty 50

## 2013-06-12 MED ORDER — NALOXONE HCL 0.4 MG/ML IJ SOLN: 0.4000 mg | INTRAMUSCULAR | Status: DC | PRN

## 2013-06-12 MED ORDER — NALBUPHINE SYRINGE 5 MG/0.5 ML
5.0000 mg | INJECTION | INTRAMUSCULAR | Status: DC | PRN
  Filled 2013-06-12: qty 1

## 2013-06-12 MED ORDER — FENTANYL CITRATE 0.05 MG/ML IJ SOLN
25.0000 ug | INTRAMUSCULAR | Status: DC | PRN
  Administered 2013-06-12: 50 ug via INTRAVENOUS

## 2013-06-12 MED ORDER — DIPHENHYDRAMINE HCL 50 MG/ML IJ SOLN: 12.5000 mg | INTRAMUSCULAR | Status: DC | PRN

## 2013-06-12 MED ORDER — NALOXONE HCL 1 MG/ML IJ SOLN
1.0000 ug/kg/h | INTRAVENOUS | Status: DC | PRN
  Filled 2013-06-12: qty 2

## 2013-06-12 MED ORDER — SCOPOLAMINE 1 MG/3DAYS TD PT72
1.0000 | MEDICATED_PATCH | Freq: Once | TRANSDERMAL | Status: DC
  Filled 2013-06-12: qty 1

## 2013-06-12 MED ORDER — LANOLIN HYDROUS EX OINT: 1.0000 "application " | TOPICAL_OINTMENT | CUTANEOUS | Status: DC | PRN

## 2013-06-12 MED ORDER — MENTHOL 3 MG MT LOZG: 1.0000 | LOZENGE | OROMUCOSAL | Status: DC | PRN

## 2013-06-12 MED ORDER — LACTATED RINGERS IV SOLN
INTRAVENOUS | Status: DC
  Administered 2013-06-12 (×3): via INTRAVENOUS

## 2013-06-12 MED ORDER — OXYCODONE-ACETAMINOPHEN 5-325 MG PO TABS
1.0000 | ORAL_TABLET | ORAL | Status: DC | PRN
  Administered 2013-06-13 (×4): 2 via ORAL
  Administered 2013-06-14: 1 via ORAL
  Administered 2013-06-14 (×3): 2 via ORAL
  Administered 2013-06-14 – 2013-06-15 (×2): 1 via ORAL
  Administered 2013-06-15 (×2): 2 via ORAL
  Filled 2013-06-12 (×5): qty 2
  Filled 2013-06-12 (×2): qty 1
  Filled 2013-06-12 (×7): qty 2

## 2013-06-12 MED ORDER — LACTATED RINGERS IV SOLN
INTRAVENOUS | Status: DC
  Administered 2013-06-12 – 2013-06-13 (×2): via INTRAVENOUS

## 2013-06-12 MED ORDER — FENTANYL CITRATE 0.05 MG/ML IJ SOLN
INTRAMUSCULAR | Status: AC
  Filled 2013-06-12: qty 2

## 2013-06-12 MED ORDER — PHENYLEPHRINE 40 MCG/ML (10ML) SYRINGE FOR IV PUSH (FOR BLOOD PRESSURE SUPPORT)
PREFILLED_SYRINGE | INTRAVENOUS | Status: AC
  Filled 2013-06-12: qty 5

## 2013-06-12 MED ORDER — PHENYLEPHRINE HCL 10 MG/ML IJ SOLN
INTRAMUSCULAR | Status: DC | PRN
  Administered 2013-06-12 (×8): 80 ug via INTRAVENOUS
  Administered 2013-06-12: 40 ug via INTRAVENOUS
  Administered 2013-06-12: 120 ug via INTRAVENOUS

## 2013-06-12 MED ORDER — PHENYLEPHRINE 40 MCG/ML (10ML) SYRINGE FOR IV PUSH (FOR BLOOD PRESSURE SUPPORT)
PREFILLED_SYRINGE | INTRAVENOUS | Status: AC
  Filled 2013-06-12: qty 10

## 2013-06-12 MED ORDER — MORPHINE SULFATE 0.5 MG/ML IJ SOLN
INTRAMUSCULAR | Status: AC
  Filled 2013-06-12: qty 10

## 2013-06-12 MED ORDER — SCOPOLAMINE 1 MG/3DAYS TD PT72
MEDICATED_PATCH | TRANSDERMAL | Status: AC
  Administered 2013-06-12: 1.5 mg via TRANSDERMAL
  Filled 2013-06-12: qty 1

## 2013-06-12 MED ORDER — OXYTOCIN 40 UNITS IN LACTATED RINGERS INFUSION - SIMPLE MED: 62.5000 mL/h | INTRAVENOUS | Status: AC

## 2013-06-12 MED ORDER — ONDANSETRON HCL 4 MG/2ML IJ SOLN
INTRAMUSCULAR | Status: DC | PRN
  Administered 2013-06-12: 4 mg via INTRAVENOUS

## 2013-06-12 MED ORDER — ALBUTEROL SULFATE HFA 108 (90 BASE) MCG/ACT IN AERS
INHALATION_SPRAY | RESPIRATORY_TRACT | Status: AC
  Filled 2013-06-12: qty 6.7

## 2013-06-12 MED ORDER — SCOPOLAMINE 1 MG/3DAYS TD PT72: 1.0000 | MEDICATED_PATCH | Freq: Once | TRANSDERMAL | Status: DC

## 2013-06-12 MED ORDER — DIBUCAINE 1 % RE OINT
1.0000 "application " | TOPICAL_OINTMENT | RECTAL | Status: DC | PRN
  Administered 2013-06-13: 1 via RECTAL
  Filled 2013-06-12 (×2): qty 28

## 2013-06-12 MED ORDER — CEFAZOLIN SODIUM-DEXTROSE 2-3 GM-% IV SOLR: 2.0000 g | INTRAVENOUS | Status: DC

## 2013-06-12 MED ORDER — MEPERIDINE HCL 25 MG/ML IJ SOLN: 6.2500 mg | INTRAMUSCULAR | Status: DC | PRN

## 2013-06-12 MED ORDER — PANTOPRAZOLE SODIUM 40 MG PO TBEC
40.0000 mg | DELAYED_RELEASE_TABLET | Freq: Every day | ORAL | Status: DC
  Administered 2013-06-13 – 2013-06-15 (×3): 40 mg via ORAL
  Filled 2013-06-12 (×4): qty 1

## 2013-06-12 MED ORDER — SIMETHICONE 80 MG PO CHEW
80.0000 mg | CHEWABLE_TABLET | ORAL | Status: DC | PRN
  Administered 2013-06-12 – 2013-06-14 (×3): 80 mg via ORAL

## 2013-06-12 MED ORDER — ONDANSETRON HCL 4 MG/2ML IJ SOLN: 4.0000 mg | Freq: Three times a day (TID) | INTRAMUSCULAR | Status: DC | PRN

## 2013-06-12 MED ORDER — SODIUM CHLORIDE 0.9 % IJ SOLN: 3.0000 mL | INTRAMUSCULAR | Status: DC | PRN

## 2013-06-12 MED ORDER — WITCH HAZEL-GLYCERIN EX PADS
1.0000 "application " | MEDICATED_PAD | CUTANEOUS | Status: DC | PRN
  Administered 2013-06-13 – 2013-06-15 (×2): 1 via TOPICAL

## 2013-06-12 MED ORDER — METOCLOPRAMIDE HCL 5 MG/ML IJ SOLN: 10.0000 mg | Freq: Three times a day (TID) | INTRAMUSCULAR | Status: DC | PRN

## 2013-06-12 MED ORDER — MEASLES, MUMPS & RUBELLA VAC ~~LOC~~ INJ
0.5000 mL | INJECTION | Freq: Once | SUBCUTANEOUS | Status: DC
  Filled 2013-06-12: qty 0.5

## 2013-06-12 MED ORDER — DIPHENHYDRAMINE HCL 25 MG PO CAPS: 25.0000 mg | ORAL_CAPSULE | Freq: Four times a day (QID) | ORAL | Status: DC | PRN

## 2013-06-12 MED ORDER — LACTATED RINGERS IV SOLN
Freq: Once | INTRAVENOUS | Status: AC
  Administered 2013-06-12: 15:00:00 via INTRAVENOUS

## 2013-06-12 MED ORDER — ONDANSETRON HCL 4 MG PO TABS: 4.0000 mg | ORAL_TABLET | ORAL | Status: DC | PRN

## 2013-06-12 MED ORDER — MORPHINE SULFATE (PF) 0.5 MG/ML IJ SOLN
INTRAMUSCULAR | Status: DC | PRN
  Administered 2013-06-12: .1 mg via INTRATHECAL

## 2013-06-12 MED ORDER — TETANUS-DIPHTH-ACELL PERTUSSIS 5-2.5-18.5 LF-MCG/0.5 IM SUSP: 0.5000 mL | Freq: Once | INTRAMUSCULAR | Status: DC

## 2013-06-12 SURGICAL SUPPLY — 32 items
CLAMP CORD UMBIL (MISCELLANEOUS) IMPLANT
CLIP FILSHIE TUBAL LIGA STRL (Clip) ×2 IMPLANT
CLOTH BEACON ORANGE TIMEOUT ST (SAFETY) ×2 IMPLANT
CONTAINER PREFILL 10% NBF 15ML (MISCELLANEOUS) IMPLANT
DRAIN JACKSON PRT FLT 7MM (DRAIN) IMPLANT
DRAPE LG THREE QUARTER DISP (DRAPES) ×2 IMPLANT
DRSG OPSITE 4X5.5 SM (GAUZE/BANDAGES/DRESSINGS) ×1 IMPLANT
DRSG OPSITE POSTOP 4X10 (GAUZE/BANDAGES/DRESSINGS) ×2 IMPLANT
DURAPREP 26ML APPLICATOR (WOUND CARE) ×2 IMPLANT
ELECT REM PT RETURN 9FT ADLT (ELECTROSURGICAL) ×2
ELECTRODE REM PT RTRN 9FT ADLT (ELECTROSURGICAL) ×1 IMPLANT
EVACUATOR SILICONE 100CC (DRAIN) IMPLANT
EXTRACTOR VACUUM M CUP 4 TUBE (SUCTIONS) IMPLANT
GLOVE BIO SURGEON STRL SZ7 (GLOVE) ×2 IMPLANT
GLOVE BIOGEL PI IND STRL 7.0 (GLOVE) ×1 IMPLANT
GLOVE BIOGEL PI INDICATOR 7.0 (GLOVE) ×1
GOWN STRL REIN XL XLG (GOWN DISPOSABLE) ×4 IMPLANT
HEMOSTAT SURGICEL 2X14 (HEMOSTASIS) ×1 IMPLANT
KIT ABG SYR 3ML LUER SLIP (SYRINGE) IMPLANT
NDL HYPO 25X5/8 SAFETYGLIDE (NEEDLE) ×1 IMPLANT
NEEDLE HYPO 25X5/8 SAFETYGLIDE (NEEDLE) ×2 IMPLANT
NS IRRIG 1000ML POUR BTL (IV SOLUTION) ×2 IMPLANT
PACK C SECTION WH (CUSTOM PROCEDURE TRAY) ×2 IMPLANT
PAD OB MATERNITY 4.3X12.25 (PERSONAL CARE ITEMS) ×2 IMPLANT
RTRCTR C-SECT PINK 25CM LRG (MISCELLANEOUS) ×2 IMPLANT
STAPLER VISISTAT 35W (STAPLE) IMPLANT
SUT VIC AB 0 CTX 36 (SUTURE) ×10
SUT VIC AB 0 CTX36XBRD ANBCTRL (SUTURE) ×5 IMPLANT
SUT VIC AB 4-0 KS 27 (SUTURE) IMPLANT
TOWEL OR 17X24 6PK STRL BLUE (TOWEL DISPOSABLE) ×6 IMPLANT
TRAY FOLEY CATH 14FR (SET/KITS/TRAYS/PACK) ×2 IMPLANT
WATER STERILE IRR 1000ML POUR (IV SOLUTION) ×2 IMPLANT

## 2013-06-12 NOTE — Transfer of Care (Signed)
Immediate Anesthesia Transfer of Care Note  Patient: [name]  Procedure(s) Performed: Procedure(s): REPEAT CESAREAN SECTION WITH BILATERAL TUBAL LIGATION (Bilateral)  Patient Location: PACU  Anesthesia Type:Spinal  Level of Consciousness: awake, alert  and oriented  Airway & Oxygen Therapy: Patient Spontanous Breathing  Post-op Assessment: Report given to PACU RN and Post -op Vital signs reviewed and stable  Post vital signs: Reviewed and stable  Complications: No apparent anesthesia complications

## 2013-06-12 NOTE — H&P (Signed)
[name] is a 37 y.o. female presenting for scheduled repeat cesarean section and BTL at [redacted] weeks EGA. Marland Kitchen History OB History   Grav Para Term Preterm Abortions TAB SAB Ect Mult Living   4 3 3       3      Past Medical History  Diagnosis Date  . Diabetes mellitus without complication     gestational diabetes with current pregnancyt   Past Surgical History  Procedure Laterality Date  . Cesarean section    . Tonsillectomy     Family History: family history is negative for Asthma, and Diabetes, and Cancer, and COPD, and Hearing loss, and Heart disease, and Hypertension, and Miscarriages / [place], and Stroke, . Social History:  reports that she quit smoking about 18 years ago. Her smoking use included Cigarettes. She has a 2 pack-year smoking history. She has never used smokeless tobacco. She reports that she does not drink alcohol or use illicit drugs.   Prenatal Transfer Tool  Maternal Diabetes: Yes:  Diabetes Type:  Diet controlled Genetic Screening: Declined Maternal Ultrasounds/Referrals: Normal Fetal Ultrasounds or other Referrals:  None Maternal Substance Abuse:  No Significant Maternal Medications:  None Significant Maternal Lab Results:  None Other Comments:  None  ROS    Blood pressure 112/63, pulse 110, temperature 98.2 F (36.8 C), temperature source Oral, resp. rate 18, last menstrual period 09/11/2012, SpO2 98.00%. Exam Physical Exam  Vitals reviewed. Constitutional: She is oriented to person, place, and time. She appears well-developed and well-nourished. No distress.  HENT:  Head: Normocephalic and atraumatic.  Mouth/Throat: Oropharynx is clear and moist.  Eyes: Conjunctivae are normal.  Neck: Neck supple. No thyromegaly present.  Cardiovascular: Normal rate and regular rhythm.   Respiratory: Effort normal and breath sounds normal.  GI: Soft. There is no tenderness. There is no rebound.  Musculoskeletal: She exhibits no tenderness.   Neurological: She is alert and oriented to person, place, and time.  Skin: Skin is warm and dry.  Psychiatric: She has a normal mood and affect.    Prenatal labs: ABO, Rh: --/--/A NEG (08/04 1600) Antibody: POS (08/04 1600) Rubella: 6.28 (04/21 0946) RPR: NON REACTIVE (08/04 1600)  HBsAg: NEGATIVE (04/21 0946)  HIV: NON REACTIVE (04/21 0946)  GBS:     Assessment/Plan: 37 yo female at 39 weeks for rpt c/s (#3) at term and undesired fertility  The risks of cesarean section discussed with the patient included but were not limited to: bleeding which may require transfusion or reoperation; infection which may require antibiotics; injury to bowel, bladder, ureters or other surrounding organs; injury to the fetus; need for additional procedures including hysterectomy in the event of a life-threatening hemorrhage; placental abnormalities wth subsequent pregnancies, incisional problems, thromboembolic phenomenon and other postoperative/anesthesia complications. The patient concurred with the proposed plan, giving informed written consent for the procedure.   Patient desires permanent sterilization.  Other reversible forms of contraception were discussed with patient; she declines all other modalities. Risks of procedure discussed with patient including but not limited to: risk of regret, permanence of method, bleeding, infection, injury to surrounding organs and need for additional procedures.  Failure risk of 0.5-1% with increased risk of ectopic gestation if pregnancy occurs was also discussed with patient.    Patient verbalized understanding of these risks and wants to proceed with sterilization.  Written informed consent obtained.  To OR when ready.     [name] H. 06/12/2013, 2:33 PM

## 2013-06-12 NOTE — Anesthesia Postprocedure Evaluation (Signed)
  Anesthesia Post-op Note  Anesthesia Post Note  Patient: [name]  Procedure(s) Performed: Procedure(s) (LRB): REPEAT CESAREAN SECTION WITH BILATERAL TUBAL LIGATION (Bilateral)  Anesthesia type: Spinal  Patient location: PACU  Post pain: Pain level controlled  Post assessment: Post-op Vital signs reviewed  Last Vitals:  Filed Vitals:   06/12/13 1757  BP: 112/72  Pulse: 88  Temp: 36.6 C  Resp: 20    Post vital signs: Reviewed  Level of consciousness: awake  Complications: No apparent anesthesia complications

## 2013-06-12 NOTE — Anesthesia Procedure Notes (Signed)
Spinal  Patient location during procedure: OR Start time: 06/12/2013 3:10 PM Staffing Performed by: anesthesiologist  Preanesthetic Checklist Completed: patient identified, site marked, surgical consent, pre-op evaluation, timeout performed, IV checked, risks and benefits discussed and monitors and equipment checked Spinal Block Patient position: sitting Prep: site prepped and draped and DuraPrep Patient monitoring: heart rate, cardiac monitor, continuous pulse ox and blood pressure Approach: midline Location: L3-4 Injection technique: single-shot Needle Needle type: Pencan  Needle gauge: 24 G Needle length: 9 cm Assessment Sensory level: T4 Additional Notes Clear free flow CSF on first attempt.  No paresthesia.  Patient tolerated procedure well with no apparent complications.  Jasmine December, MD

## 2013-06-12 NOTE — Op Note (Signed)
Corey Harold PROCEDURE DATE: 06/12/2013  PREOPERATIVE DIAGNOSIS: Intrauterine pregnancy at  [redacted]w[redacted]d weeks gestation for elective repeat cesarean section and undesired fertility  POSTOPERATIVE DIAGNOSIS: The same  PROCEDURE:    Low Transverse Cesarean Section, Bilateral Tubal Ligation, Lysis of adhesions  SURGEON:  Dr. Elsie Lincoln  ASSISTANT: Dr. Dia Crawford  INDICATIONS: BETHANIE BLOXOM is a 37 y.o. [id number] at [redacted]w[redacted]d scheduled for elective repeat cesarean section.  The risks of cesarean section discussed with the patient included but were not limited to: bleeding which may require transfusion or reoperation; infection which may require antibiotics; injury to bowel, bladder, ureters or other surrounding organs; injury to the fetus; need for additional procedures including hysterectomy in the event of a life-threatening hemorrhage; placental abnormalities wth subsequent pregnancies, incisional problems, thromboembolic phenomenon and other postoperative/anesthesia complications. The patient concurred with the proposed plan, giving informed written consent for the procedure.    FINDINGS:  Viable female infant in cephalic presentation,  clear amniotic fluid.  Intact placenta, three vessel cord.  Grossly normal uterus, ovaries and fallopian tubes.  Large amount of scar tissue involving the anterior abdominal wall. .   ANESTHESIA:    Epidural ESTIMATED BLOOD LOSS: 800cc SPECIMENS: Placenta sent to L&D COMPLICATIONS: None immediate  PROCEDURE IN DETAIL:  The patient received intravenous antibiotics and had sequential compression devices applied to her lower extremities while in the preoperative area.  She was then taken to the operating room where spinal anesthesia was administered and was found to be adequate. She was then placed in a dorsal supine position with a leftward tilt, and prepped and draped in a sterile manner.  A foley catheter was placed into her bladder and attached to constant  gravity.  After an adequate timeout was performed, a Pfannenstiel skin incision was made with scalpel and carried through to the underlying layer of fascia. The fascia was incised in the midline and this incision was extended bilaterally using the Mayo scissors. Kocher clamps were applied to the superior aspect of the fascial incision and the underlying rectus muscles were dissected off bluntly. A similar process was carried out on the inferior aspect of the facial incision. The rectus muscles were separated in the midline bluntly and the peritoneum was entered bluntly.   A transverse hysterotomy was made with a scalpel and extended bilaterally bluntly. The bladder blade was then removed. The infant was successfully delivered, and cord was clamped and cut and infant was handed over to awaiting neonatology team. Uterine massage was then administered and the placenta delivered intact with three-vessel cord. The uterus was cleared of clot and debris.  The hysterotomy was closed with 0 vicryl and noted to be hemostatic.  The tubal ligation was performed.  Each fallopian tube was follow out to its fimibriated end and a Filshie Clip was applied transversely across the fallopian tube, being carefully to encompass the entire tubal diameter.  The peritoneum and rectus muscles were noted to be hemostatic.  The fascia was closed with 0-Vicryl in a running fashion with good restoration of anatomy.  The subcutaneus tissue was copiously irrigated.  The skin was closed with 4-0 Vicryl in a subcuticular fashion.  Steri strips were placed across the incision.  Pt tolerated the procedure will.  All counts were correct x2.  Pt went to the recovery room in stable condition.

## 2013-06-12 NOTE — Anesthesia Preprocedure Evaluation (Signed)
Anesthesia Evaluation  Patient identified by MRN, date of birth, ID band Patient awake    Reviewed: Allergy & Precautions, H&P , NPO status , Patient's Chart, lab work & pertinent test results, reviewed documented beta blocker date and time   History of Anesthesia Complications Negative for: history of anesthetic complications  Airway Mallampati: III TM Distance: >3 FB Neck ROM: full    Dental  (+) Teeth Intact   Pulmonary sleep apnea and Continuous Positive Airway Pressure Ventilation ,  breath sounds clear to auscultation        Cardiovascular negative cardio ROS  Rhythm:regular Rate:Normal     Neuro/Psych  Headaches (migraines - last was a month ago), PSYCHIATRIC DISORDERS (panic d/o)    GI/Hepatic Neg liver ROS, GERD- (with pregnancy)  Medicated,  Endo/Other  diabetes, Well Controlled, GestationalBMI 35.7  Renal/GU negative Renal ROS  negative genitourinary   Musculoskeletal   Abdominal   Peds  Hematology negative hematology ROS (+)   Anesthesia Other Findings Patient states allergy is to TORADOL - NOT tramadol  Reproductive/Obstetrics (+) Pregnancy (h/o c/s x2, for repeat and BTL)                           Anesthesia Physical Anesthesia Plan  ASA: III  Anesthesia Plan: Epidural   Post-op Pain Management:    Induction:   Airway Management Planned:   Additional Equipment:   Intra-op Plan:   Post-operative Plan:   Informed Consent: I have reviewed the patients History and Physical, chart, labs and discussed the procedure including the risks, benefits and alternatives for the proposed anesthesia with the patient or authorized representative who has indicated his/her understanding and acceptance.     Plan Discussed with:   Anesthesia Plan Comments:         Anesthesia Quick Evaluation

## 2013-06-13 LAB — CBC
HCT: 29.9 % — ABNORMAL LOW (ref 36.0–46.0)
Hemoglobin: 10.2 g/dL — ABNORMAL LOW (ref 12.0–15.0)
RDW: 13.8 % (ref 11.5–15.5)
WBC: 11.9 10*3/uL — ABNORMAL HIGH (ref 4.0–10.5)

## 2013-06-13 MED ORDER — RHO D IMMUNE GLOBULIN 1500 UNIT/2ML IJ SOLN
300.0000 ug | Freq: Once | INTRAMUSCULAR | Status: AC
Start: 2013-06-13 — End: 2013-06-13
  Administered 2013-06-13: 300 ug via INTRAMUSCULAR
  Filled 2013-06-13: qty 2

## 2013-06-13 MED ORDER — OXYCODONE HCL 5 MG PO TABS
10.0000 mg | ORAL_TABLET | Freq: Once | ORAL | Status: AC
  Administered 2013-06-13: 10 mg via ORAL
  Filled 2013-06-13: qty 2

## 2013-06-13 NOTE — Anesthesia Postprocedure Evaluation (Signed)
  Anesthesia Post-op Note  Patient: [name]  Procedure(s) Performed: Procedure(s): REPEAT CESAREAN SECTION WITH BILATERAL TUBAL LIGATION (Bilateral)  Patient Location: PACU and Mother/Baby  Anesthesia Type:Epidural  Level of Consciousness: awake, alert  and oriented  Airway and Oxygen Therapy: Patient Spontanous Breathing  Post-op Pain: none  Post-op Assessment: Post-op Vital signs reviewed, Patient's Cardiovascular Status Stable, No headache, No backache, No residual numbness and No residual motor weakness  Post-op Vital Signs: Reviewed and stable  Complications: No apparent anesthesia complications

## 2013-06-13 NOTE — Progress Notes (Signed)
UR chart review completed.  

## 2013-06-13 NOTE — Progress Notes (Signed)
Patient was referred for history of depression/anxiety.  * Referral screened out by Clinical Social Worker because none of the following criteria appear to apply:  ~ History of anxiety/depression during this pregnancy, or of post-partum depression.  ~ Diagnosis of anxiety and/or depression within last 3 years  ~ History of depression due to pregnancy loss/loss of child  OR  * Patient's symptoms currently being treated with medication and/or therapy.  Please contact the Clinical Social Worker if needs arise, or by the patient's request.  Pt was prescribed Xanax by Dr. Evelene Croon, her psychiatrist & reports taking the medication PRN. She told CSW that she would take "1 every couple of month." She denies any depression symptoms, PP depression or SI history. She plans to continue to see Dr. Evelene Croon upon discharge.

## 2013-06-13 NOTE — Progress Notes (Signed)
Subjective: Postpartum Day 1: Cesarean Delivery Patient reports incisional pain, tolerating PO and no problems voiding.   No bowel movement or flatus  Objective: Vital signs in last 24 hours: Temp:  [97.7 F (36.5 C)-99.2 F (37.3 C)] 98.7 F (37.1 C) (08/06 0022) Pulse Rate:  [88-110] 104 (08/06 0220) Resp:  [13-21] 20 (08/06 0022) BP: (94-112)/(54-72) 95/57 mmHg (08/06 0422) SpO2:  [95 %-98 %] 95 % (08/06 0220) Weight:  [88.451 kg (195 lb)] 88.451 kg (195 lb) (08/05 1415)  Physical Exam:  General: alert, cooperative and appears stated age Lochia: appropriate Uterine Fundus: firm Incision: Bandage is clean dry and intact. DVT Evaluation: No evidence of DVT seen on physical exam. Negative Homan's sign. No cords or calf tenderness. Heart: Normal rate and rhythm, no clicks, rubs or gallops Lungs: Normal vesicular sounds in all fields GI: Normal bowel sounds present..    Results for orders placed during the hospital encounter of 06/12/13 (from the past 24 hour(s))  GLUCOSE, CAPILLARY     Status: None   Collection Time    06/12/13  2:27 PM      Result Value Range   Glucose-Capillary 75  70 - 99 mg/dL  GLUCOSE, CAPILLARY     Status: None   Collection Time    06/12/13  4:55 PM      Result Value Range   Glucose-Capillary 74  70 - 99 mg/dL  CBC     Status: Abnormal   Collection Time    06/13/13  6:05 AM      Result Value Range   WBC 11.9 (*) 4.0 - 10.5 K/uL   RBC 3.44 (*) 3.87 - 5.11 MIL/uL   Hemoglobin 10.2 (*) 12.0 - 15.0 g/dL   HCT 69.6 (*) 29.5 - 28.4 %   MCV 86.9  78.0 - 100.0 fL   MCH 29.7  26.0 - 34.0 pg   MCHC 34.1  30.0 - 36.0 g/dL   RDW 13.2  44.0 - 10.2 %   Platelets 193  150 - 400 K/uL         Recent Labs  06/11/13 1600 06/13/13 0605  HGB 12.5 10.2*  HCT 36.2 29.9*    Assessment/Plan: Status post Cesarean section. Doing well postoperatively. Patient complains of 8/10 pain at the incisional site. Continue current care.  [name]  L 06/13/2013, 7:50 AM

## 2013-06-14 ENCOUNTER — Encounter (HOSPITAL_COMMUNITY): Payer: Self-pay | Admitting: Obstetrics & Gynecology

## 2013-06-14 LAB — RH IG WORKUP (INCLUDES ABO/RH)
ABO/RH(D): A NEG
Fetal Screen: NEGATIVE
Unit division: 0

## 2013-06-14 MED ORDER — DOCUSATE SODIUM 100 MG PO CAPS
100.0000 mg | ORAL_CAPSULE | Freq: Every day | ORAL | Status: DC | PRN
  Administered 2013-06-14: 100 mg via ORAL
  Filled 2013-06-14: qty 1

## 2013-06-14 MED ORDER — SENNA 8.6 MG PO TABS
1.0000 | ORAL_TABLET | Freq: Every day | ORAL | Status: DC | PRN
  Administered 2013-06-14: 8.6 mg via ORAL
  Filled 2013-06-14: qty 1

## 2013-06-14 MED ORDER — HYDROCORTISONE ACE-PRAMOXINE 1-1 % RE FOAM
Freq: Three times a day (TID) | RECTAL | Status: DC | PRN
  Administered 2013-06-14: 09:00:00 via RECTAL
  Filled 2013-06-14: qty 10

## 2013-06-14 NOTE — Progress Notes (Signed)
Subjective: Postpartum Day 2: Cesarean Delivery Patient reports tolerating PO and no problems voiding.  Has not had BM or passed gas. Out of bed but in pain, primarily mid abdomen.   Objective: Vital signs in last 24 hours: Temp:  [98.2 F (36.8 C)-98.4 F (36.9 C)] 98.2 F (36.8 C) (08/07 0530) Pulse Rate:  [92-122] 100 (08/07 0530) Resp:  [16-18] 16 (08/07 0530) BP: (96-100)/(60-72) 98/60 mmHg (08/07 0530) SpO2:  [94 %-96 %] 94 % (08/06 1529)  Physical Exam:  General: alert, cooperative and no distress Heart: RRR, normal s1/s2 Lungs: CTAB, effort normal Abdomen: no bowel sounds appreciated Lochia: appropriate Uterine Fundus: firm Incision: healing well, no significant drainage, no dehiscence, no significant erythema. Dressing changed yesterday because it was soaked, but today the dressing is clean. DVT Evaluation: No evidence of DVT seen on physical exam. No cords or calf tenderness. No significant calf/ankle edema.   Recent Labs  06/11/13 1600 06/13/13 0605  HGB 12.5 10.2*  HCT 36.2 29.9*    Assessment/Plan: Status post Cesarean section. Patient complaining of pain, has not had BM/flatus  Continue current care.  Tawni Carnes 06/14/2013, 7:36 AM  I have seen and examined this patient and agree with above documentation in the resident's note. Incision c/d/I. Will observe mom today for passing flatus and improved pain control. Likely d/c tomorrow.   Rulon Abide, M.D. [facility] Fellow 06/14/2013 7:56 AM

## 2013-06-15 ENCOUNTER — Encounter (HOSPITAL_COMMUNITY): Payer: Self-pay | Admitting: *Deleted

## 2013-06-15 ENCOUNTER — Ambulatory Visit (INDEPENDENT_AMBULATORY_CARE_PROVIDER_SITE_OTHER): Payer: Medicaid Other | Admitting: Surgery

## 2013-06-15 ENCOUNTER — Encounter (INDEPENDENT_AMBULATORY_CARE_PROVIDER_SITE_OTHER): Payer: Self-pay | Admitting: Surgery

## 2013-06-15 VITALS — BP 128/76 | HR 84 | Resp 16 | Ht 62.0 in | Wt 184.6 lb

## 2013-06-15 DIAGNOSIS — K645 Perianal venous thrombosis: Secondary | ICD-10-CM

## 2013-06-15 LAB — TYPE AND SCREEN
ABO/RH(D): A NEG
Antibody Screen: POSITIVE
Unit division: 0

## 2013-06-15 MED ORDER — HYDROCORTISONE ACE-PRAMOXINE 1-1 % RE FOAM: Freq: Three times a day (TID) | RECTAL | Status: DC | PRN

## 2013-06-15 MED ORDER — OXYCODONE-ACETAMINOPHEN 5-325 MG PO TABS: 1.0000 | ORAL_TABLET | ORAL | Status: DC | PRN

## 2013-06-15 MED ORDER — DSS 100 MG PO CAPS: 100.0000 mg | ORAL_CAPSULE | Freq: Every day | ORAL | Status: DC | PRN

## 2013-06-15 MED ORDER — LANOLIN HYDROUS EX OINT: 1.0000 "application " | TOPICAL_OINTMENT | CUTANEOUS | Status: DC | PRN

## 2013-06-15 MED ORDER — WITCH HAZEL-GLYCERIN EX PADS: 1.0000 "application " | MEDICATED_PAD | CUTANEOUS | Status: DC | PRN

## 2013-06-15 NOTE — Discharge Summary (Signed)
Obstetric Discharge Summary Reason for Admission: cesarean section Prenatal Procedures: none Intrapartum Procedures: cesarean: low cervical, transverse and BTL Postpartum Procedures: none Complications-Operative and Postpartum: thrombosed external hemorrhoid Hemoglobin  Date Value Range Status  06/13/2013 10.2* 12.0 - 15.0 g/dL Final     DELTA CHECK NOTED     REPEATED TO VERIFY     HCT  Date Value Range Status  06/13/2013 29.9* 36.0 - 46.0 % Final    Physical Exam:  General: alert, cooperative and no distress Lochia: appropriate Uterine Fundus: firm Incision: healing well, no significant drainage, no dehiscence, no significant erythema DVT Evaluation: No evidence of DVT seen on physical exam. RECTAL: thrombosed external hemorrhoid appr 3cm in length on right side of sphincter.   Discharge Diagnoses: Term Pregnancy-delivered and thrombosed hemorrhoid  Discharge Information: Date: 06/15/2013 Activity: pelvic rest Diet: routine Medications: Colace, Iron, Percocet and hemorrhoid medication Condition: stable Instructions: refer to practice specific booklet Discharge to: home Follow-up Information   Follow up with [facility] On 06/15/2013. (at 3:30pm)    Contact information:   [address]   [place] [address] [phone]       [facility] 6 weeks.   Contact information:   [address] [place] [address] [phone]      Newborn Data: Live born female  Birth Weight: 7 lb 8.5 oz (3415 g) APGAR: 9, 9  Home with mother.  Pt is s/p repeat LTCS and BTL. No complications.  She is breast feeding and did develop a thrombosed hemorrhoid. An appt was made for her with the general surgeon in the afternoon of the day of discharge for possible evacuation.  Pt was notified.   Zipporah Finamore L 06/15/2013, 9:59 AM

## 2013-06-15 NOTE — Progress Notes (Signed)
URGENT Office [name] 37 y.o.  Body mass index is 33.76 kg/(m^2).  Patient Active Problem List   Diagnosis Date Noted  . AMA (advanced maternal age) multigravida 35+ 05/23/2013  . Gestational diabetes mellitus in pregnancy 05/23/2013  . Sleep apnea, obstructive 04/30/2013  . Late prenatal care 02/26/2013  . Previous cesarean delivery, delivered, with or without mention of antepartum condition 02/26/2013  . PANIC DISORDER 06/27/2007  . MIGRAINES, HX OF 06/27/2007    Allergies  Allergen Reactions  . Toradol (Ketorolac Tromethamine)     Makes her sleepy  . Tramadol     Couldn't be awakened    Past Surgical History  Procedure Laterality Date  . Cesarean section    . Tonsillectomy    . Cesarean section with bilateral tubal ligation Bilateral 06/12/2013    Procedure: REPEAT CESAREAN SECTION WITH BILATERAL TUBAL LIGATION;  Surgeon: Lesly Dukes, MD;  Location: [facility];  Service: Obstetrics;  Laterality: Bilateral;   No PCP Per Patient No diagnosis found.  4 days post c section with left sided thrombosed hemorrhoids.  Anorectal block with 10 cc of lido/marcaine/neut.  Multiple excisions and multiple clots extracted.  Hemorrhoidal mass reduced into the rectum.  KY and gloves given and patient instructed on maintaining reduction of hemorrhoids.   [name], MD, [facility] [phone] beeper [phone]  06/15/2013 5:04 PM

## 2013-06-15 NOTE — Lactation Note (Signed)
This note was copied from the chart of Ebony Ermie Glendenning. Lactation Consultation Note  Patient Name: [name] [id number]'[id number] Date: 06/15/2013 Reason for consult: Follow-up assessment Nipples are red and tender with positional stripes. Experienced breastfeeding mother. She latches her baby independently and advised to bring the baby deeper onto the breast. Breast are full and hard but compressible for baby to latch. Infant fed well with swallows before coming off the breast for circumcision. Mother will pump slightly for relief and resume feeding on baby's return. Breast shells for soreness and mother has used in the past. Comfort gels for to promote healing of damaged nipple tissue. Reminded of LC services and support after discharge.  Maternal Data    Feeding Feeding Type: Breast Milk Length of feed: 5 min  LATCH Score/Interventions Latch: Grasps breast easily, tongue down, lips flanged, rhythmical sucking.  Audible Swallowing: Spontaneous and intermittent  Type of Nipple: Everted at rest and after stimulation  Comfort (Breast/Nipple): Filling, red/small blisters or bruises, mild/mod discomfort Problem noted: Cracked, bleeding, blisters, bruises Intervention(s): Other (comment);Expressed breast milk to nipple (comfort gels given)  Problem noted: Filling;Cracked, bleeding, blisters, bruises;Mild/Moderate discomfort Interventions (Filling): Double electric pump Interventions (Mild/moderate discomfort): Comfort gels  Hold (Positioning): No assistance needed to correctly position infant at breast. Intervention(s): Breastfeeding basics reviewed;Support Pillows;Skin to skin  LATCH Score: 9  Lactation Tools Discussed/Used WIC Program: Yes Initiated by::  (was set up by RN)   Consult Status Consult Status: Complete    [name] 06/15/2013, 10:07 AM

## 2013-06-15 NOTE — Patient Instructions (Addendum)
Hemorrhoids Hemorrhoids are swollen veins around the rectum or anus. There are two types of hemorrhoids:   Internal hemorrhoids. These occur in the veins just inside the rectum. They may poke through to the outside and become irritated and painful.  External hemorrhoids. These occur in the veins outside the anus and can be felt as a painful swelling or hard lump near the anus. CAUSES  Pregnancy.   Obesity.   Constipation or diarrhea.   Straining to have a bowel movement.   Sitting for long periods on the toilet.  Heavy lifting or other activity that caused you to strain.  Anal intercourse. SYMPTOMS   Pain.   Anal itching or irritation.   Rectal bleeding.   Fecal leakage.   Anal swelling.   One or more lumps around the anus.  DIAGNOSIS  Your caregiver may be able to diagnose hemorrhoids by visual examination. Other examinations or tests that may be performed include:   Examination of the rectal area with a gloved hand (digital rectal exam).   Examination of anal canal using a small tube (scope).   A blood test if you have lost a significant amount of blood.  A test to look inside the colon (sigmoidoscopy or colonoscopy). TREATMENT Most hemorrhoids can be treated at home. However, if symptoms do not seem to be getting better or if you have a lot of rectal bleeding, your caregiver may perform a procedure to help make the hemorrhoids get smaller or remove them completely. Possible treatments include:   Placing a rubber band at the base of the hemorrhoid to cut off the circulation (rubber band ligation).   Injecting a chemical to shrink the hemorrhoid (sclerotherapy).   Using a tool to burn the hemorrhoid (infrared light therapy).   Surgically removing the hemorrhoid (hemorrhoidectomy).   Stapling the hemorrhoid to block blood flow to the tissue (hemorrhoid stapling).  HOME CARE INSTRUCTIONS   Eat foods with fiber, such as whole grains, beans,  nuts, fruits, and vegetables. Ask your doctor about taking products with added fiber in them (fibersupplements).  Increase fluid intake. Drink enough water and fluids to keep your urine clear or pale yellow.   Exercise regularly.   Go to the bathroom when you have the urge to have a bowel movement. Do not wait.   Avoid straining to have bowel movements.   Keep the anal area dry and clean. Use wet toilet paper or moist towelettes after a bowel movement.   Medicated creams and suppositories may be used or applied as directed.   Only take over-the-counter or prescription medicines as directed by your caregiver.   Take warm sitz baths for 15 20 minutes, 3 4 times a day to ease pain and discomfort.   Place ice packs on the hemorrhoids if they are tender and swollen. Using ice packs between sitz baths may be helpful.   Put ice in a plastic bag.   Place a towel between your skin and the bag.   Leave the ice on for 15 20 minutes, 3 4 times a day.   Do not use a donut-shaped pillow or sit on the toilet for long periods. This increases blood pooling and pain.  SEEK MEDICAL CARE IF:  You have increasing pain and swelling that is not controlled by treatment or medicine.  You have uncontrolled bleeding.  You have difficulty or you are unable to have a bowel movement.  You have pain or inflammation outside the area of the hemorrhoids. MAKE SURE YOU:    Understand these instructions.  Will watch your condition.  Will get help right away if you are not doing well or get worse. Document Released: 10/22/2000 Document Revised: 10/11/2012 Document Reviewed: 08/29/2012 Exit[employer] Patient Information 2014 [employer].  

## 2013-06-16 ENCOUNTER — Emergency Department (HOSPITAL_COMMUNITY)
Admission: EM | Admit: 2013-06-16 | Discharge: 2013-06-16 | Disposition: A | Discharge status: Home / Self Care | Payer: Medicaid Other | Attending: Emergency Medicine | Admitting: Emergency Medicine

## 2013-06-16 ENCOUNTER — Encounter (HOSPITAL_COMMUNITY): Payer: Self-pay | Admitting: *Deleted

## 2013-06-16 DIAGNOSIS — O34219 Maternal care for unspecified type scar from previous cesarean delivery: Secondary | ICD-10-CM | POA: Insufficient documentation

## 2013-06-16 DIAGNOSIS — K645 Perianal venous thrombosis: Secondary | ICD-10-CM

## 2013-06-16 DIAGNOSIS — Z79899 Other long term (current) drug therapy: Secondary | ICD-10-CM | POA: Insufficient documentation

## 2013-06-16 DIAGNOSIS — Z98891 History of uterine scar from previous surgery: Secondary | ICD-10-CM

## 2013-06-16 DIAGNOSIS — R6883 Chills (without fever): Secondary | ICD-10-CM

## 2013-06-16 DIAGNOSIS — Z87891 Personal history of nicotine dependence: Secondary | ICD-10-CM | POA: Insufficient documentation

## 2013-06-16 DIAGNOSIS — Z8632 Personal history of gestational diabetes: Secondary | ICD-10-CM | POA: Insufficient documentation

## 2013-06-16 DIAGNOSIS — K5900 Constipation, unspecified: Secondary | ICD-10-CM

## 2013-06-16 MED ORDER — HYDROCORTISONE ACE-PRAMOXINE 1-1 % RE FOAM: 1.0000 | Freq: Two times a day (BID) | RECTAL | Status: DC

## 2013-06-16 MED ORDER — LIDOCAINE 4 % EX CREA
TOPICAL_CREAM | Freq: Once | CUTANEOUS | Status: AC
  Administered 2013-06-16: 1 via TOPICAL
  Filled 2013-06-16: qty 5

## 2013-06-16 NOTE — ED Provider Notes (Signed)
CSN: [id number]     Arrival date & time 06/16/13  0041 History     First MD Initiated Contact with Patient 06/16/13 0046     Chief Complaint  Patient presents with  . Chills   HPI KAMICA FLORANCE is a 37 y.o. female patient is 7 days postpartum from C-section. She's been doing well, no pain at the incision site, no abdominal pain no chest pain, shortness of breath. Patient has not had a bowel movement in 5 days and has had some left upper quadrant left flank pain. Crampy, comes and goes. Patient's chief complaint is an episode of chills approximately one hour prior to arrival. This is resolved on my exam. She denies any foul smelling lochia, she says her discharge is decreasing. No other vaginal complaints.  Patient does have history of hemorrhoids as well, these were recently evacuated for thrombosis by Dr. Daphine Deutscher a few days ago.   Past Medical History  Diagnosis Date  . Diabetes mellitus without complication     gestational diabetes with current pregnancyt   Past Surgical History  Procedure Laterality Date  . Cesarean section    . Tonsillectomy    . Cesarean section with bilateral tubal ligation Bilateral 06/12/2013    Procedure: REPEAT CESAREAN SECTION WITH BILATERAL TUBAL LIGATION;  Surgeon: Lesly Dukes, MD;  Location: [facility];  Service: Obstetrics;  Laterality: Bilateral;   Family History  Problem Relation Age of Onset  . Asthma Neg Hx   . Diabetes Neg Hx   . Cancer Neg Hx   . COPD Neg Hx   . Hearing loss Neg Hx   . Heart disease Neg Hx   . Hypertension Neg Hx   . Miscarriages / Stillbirths Neg Hx   . Stroke Neg Hx    History  Substance Use Topics  . Smoking status: Former Smoker -- 1.00 packs/day for 2 years    Types: Cigarettes    Quit date: 12/09/1994  . Smokeless tobacco: Never Used  . Alcohol Use: No   OB History   Grav Para Term Preterm Abortions TAB SAB Ect Mult Living   4 4 4       4      Review of Systems At least 10pt or greater review of systems  completed and are negative except where specified in the HPI.  Allergies  Toradol and Tramadol  Home Medications   Current Outpatient Rx  Name  Route  Sig  Dispense  Refill  . docusate sodium 100 MG CAPS   Oral   Take 100 mg by mouth daily as needed.   30 capsule   0   . hydrocortisone-pramoxine (PROCTOFOAM-HC) rectal foam   Rectal   Place rectally 3 (three) times daily as needed.   10 g   0   . lanolin OINT   Topical   Apply 1 application topically as needed (for breast care).   5 g   3   . oxyCODONE-acetaminophen (PERCOCET/ROXICET) 5-325 MG per tablet   Oral   Take 1-2 tablets by mouth every 4 (four) hours as needed.   60 tablet   0   . pantoprazole (PROTONIX) 40 MG tablet   Oral   Take 1 tablet (40 mg total) by mouth daily.   30 tablet   3   . Prenatal Vit-Fe Fumarate-FA (PRENATAL MULTIVITAMIN) TABS   Oral   Take 1 tablet by mouth daily at 12 noon.   30 tablet   2   . witch  hazel-glycerin (TUCKS) pad   Topical   Apply 1 application topically as needed.   40 each   12   . hydrocortisone-pramoxine (PROCTOFOAM HC) rectal foam   Rectal   Place 1 applicator rectally 2 (two) times daily.   10 g   0    BP 100/63  Pulse 76  Temp(Src) 98.3 F (36.8 C) (Rectal)  Resp 14  SpO2 95%  LMP 09/11/2012 Physical Exam  Nursing notes reviewed.  Electronic medical record reviewed. VITAL SIGNS:   Filed Vitals:   06/16/13 0100 06/16/13 0105 06/16/13 0122 06/16/13 0145  BP: 102/65 102/65  100/63  Pulse: 97 86  76  Temp:   98.3 F (36.8 C)   TempSrc:   Rectal   Resp:  14    SpO2: 97% 93%  95%   CONSTITUTIONAL: Awake, oriented, appears non-toxic HENT: Atraumatic, normocephalic, oral mucosa pink and moist, airway patent. Nares patent without drainage. External ears normal. EYES: Conjunctiva clear, EOMI, PERRLA NECK: Trachea midline, non-tender, supple CARDIOVASCULAR: Normal heart rate, Normal rhythm, No murmurs, rubs, gallops PULMONARY/CHEST: Clear to  auscultation, no rhonchi, wheezes, or rales. Symmetrical breath sounds. Non-tender. ABDOMINAL: Slightly enlarged status post pregnancy, soft, appropriately tender over incision - no rebound or guarding.   BS normal. C-section incision is clean dry and intact. Rectal: Large thrombosed external hemorrhoid, tender to palpation, no active bleeding NEUROLOGIC: Non-focal, moving all four extremities, no gross sensory or motor deficits. EXTREMITIES: No clubbing, cyanosis, or edema SKIN: Warm, Dry, No erythema, No rash  ED Course   Procedures (including critical care time)  Labs Reviewed - No data to display No results found. 1. Thrombosed hemorrhoids   2. Chills   3. Constipation, acute   4. H/O cesarean section    Medications  lidocaine (LMX) 4 % cream (1 application Topical Given 06/16/13 0154)     MDM  Patient presents with chills. She has no fever - chest rectally, she is not tachycardic. He appears in no apparent distress. Wound looks clean dry and intact, abdomen is soft nontender, uterus is appropriately tender-I. do not think she's got endometritis. Do not think she's got a surgical site infection. I do not think labs are indicated at this time. Patient's hemorrhoid has rethrombosed she declines a surgical consult in the emergency department and will see Dr. Daphine Deutscher in the office next week. I will send her home with medicine, lidocaine cream as well as steroid cream to help with hemorrhoid swelling.   I explained the diagnosis and have given explicit precautions to return to the ER including any other new or worsening symptoms. The patient understands and accepts the medical plan as it's been dictated and I have answered their questions. Discharge instructions concerning home care and prescriptions have been given.  The patient is STABLE and is discharged to home in good condition.   Jones Skene, MD 06/16/13 0425

## 2013-06-16 NOTE — ED Notes (Signed)
The pt is one week post partum  From a c-section.  aprox one hour the pt was having chills and shivering coming from the br.  She has a headache.  She feels hot now

## 2013-06-16 NOTE — ED Notes (Signed)
Per [facility] pt from home, pt c/o hot flashes and cold chills starting 45 mins ago. Pt had a C-section 06/12/2013 and is concerned if she has some type of infection. Pt denies N/V/D or fever. VSS SBP palpated 118, HR 104

## 2013-06-16 NOTE — ED Notes (Addendum)
Pt reports she had a scheduled cesarean section on Tuesday, August 5. Pt states she had been feeling okay but tonight she had the chills that would not go away. Pt states this worried her and her husband so she came to the ED to be checked out. Pt reports no abnormal drainage from the wound, pt reports she has not changed the dressing since she came home yesterday morning, August 8. Pt states denies any fever, change in LOC, N/V, SOB. Pt denies any abnormal discharge. Pt also reports a HA. Pt states she took two percocet tablets before she came in tonight, pt states percocet sometimes make her head hurt.

## 2013-06-18 ENCOUNTER — Telehealth (INDEPENDENT_AMBULATORY_CARE_PROVIDER_SITE_OTHER): Payer: Self-pay

## 2013-06-18 ENCOUNTER — Ambulatory Visit (INDEPENDENT_AMBULATORY_CARE_PROVIDER_SITE_OTHER): Payer: Medicaid Other | Admitting: Surgery

## 2013-06-18 ENCOUNTER — Encounter (INDEPENDENT_AMBULATORY_CARE_PROVIDER_SITE_OTHER): Payer: Self-pay | Admitting: Surgery

## 2013-06-18 VITALS — BP 116/80 | HR 68 | Temp 97.7°F | Resp 16 | Ht 62.0 in | Wt 178.0 lb

## 2013-06-18 DIAGNOSIS — K645 Perianal venous thrombosis: Secondary | ICD-10-CM

## 2013-06-18 NOTE — Telephone Encounter (Signed)
Pt called stating hems are larger and more painful since seen Friday in [place] office. Pt states she has small amt of bleeding. Pt given appt for today to have hems re-evaluated.

## 2013-06-18 NOTE — Progress Notes (Signed)
[facility]  Ovidio Kin, MD,  FACS [address] [place]  [address] [place], [place]    [address] Phone:  [phone] FAX:  [phone]   Re:   [name] DOB:   November 01, 1976 MRN:   [id number]  Urgent Office  ASSESSMENT AND PLAN: 1.  Hemorrhoids, left side  Chronic thrombosed/edematous  I don't think that anything else should be done now.  I gave her literature on hemorrhoids.  These will resolve, though could take some time.  Her return is PRN.  2.  C section - 06/12/2013  Has baby boy - Industrial/product designer. 3.  Gestational diabetes 4.  OSA  HISTORY OF PRESENT ILLNESS: Chief Complaint  Patient presents with  . URGENT    recurrent thromb hems   [name] is a 37 y.o. (DOB: 03-13-1976)  white  female who is a patient of No PCP Per Patient and comes to Urgent Office today for hemorrhoids.  The patient was seen Friday, 8/7, by Dr. Daphine Deutscher in the urgent office for hemorrhoids.  She went to the ER on Saturday,but not entirely sure.  The ER physician, who went to take a rectal temp (?), told her about the thrombosed hemorrhoids and thought she ought to be seen further.  She has no chronic rectal or colon disease.  She had a  C section last week for her 4th child.  His name is Industrial/product designer.  She has a 18, 7, and 38 yo already.  Her husband has Anette Riedel in the waiting area.  Social History: Married  PHYSICAL EXAM: BP 116/80  Pulse 68  Temp(Src) 97.7 F (36.5 C) (Temporal)  Resp 16  Ht 5\' 2"  (1.575 m)  Wt 178 lb (80.74 kg)  BMI 32.55 kg/m2  LMP 09/11/2012  Rectum - 4 cm chronically thrombosed/edematous hemorrhoids.  The is nothing to acutely decompress.  She feels better than Friday.   I think that more surgery at this time would not benefit her.  I think that with sitz baths and local care, these will resolve.     DATA REVIEWED: Epic notes   Ovidio Kin, MD,  [facility], [place] [address] [place]  [address]   [place], [place]     [address] Phone:  [phone] FAX:  [phone]

## 2013-06-18 NOTE — Discharge Summary (Signed)
Attestation of Attending Supervision of Advanced Practitioner (CNM/NP): Evaluation and management procedures were performed by the Advanced Practitioner under my supervision and collaboration.  I have reviewed the Advanced Practitioner's note and chart, and I agree with the management and plan.  Rael Tilly 06/18/2013 9:06 AM

## 2013-06-19 ENCOUNTER — Encounter: Payer: Self-pay | Admitting: Nurse Practitioner

## 2013-06-22 ENCOUNTER — Other Ambulatory Visit: Payer: Self-pay | Admitting: Obstetrics and Gynecology

## 2013-06-22 DIAGNOSIS — E119 Type 2 diabetes mellitus without complications: Secondary | ICD-10-CM

## 2013-07-06 ENCOUNTER — Encounter: Payer: Self-pay | Admitting: *Deleted

## 2013-07-19 ENCOUNTER — Inpatient Hospital Stay (HOSPITAL_COMMUNITY)
Admission: AD | Admit: 2013-07-19 | Discharge: 2013-07-19 | Disposition: A | Discharge status: Home / Self Care | Payer: Medicaid Other | Source: Ambulatory Visit | Attending: Obstetrics & Gynecology | Admitting: Obstetrics & Gynecology

## 2013-07-19 ENCOUNTER — Encounter (HOSPITAL_COMMUNITY): Payer: Self-pay | Admitting: *Deleted

## 2013-07-19 DIAGNOSIS — G43909 Migraine, unspecified, not intractable, without status migrainosus: Secondary | ICD-10-CM

## 2013-07-19 DIAGNOSIS — O99893 Other specified diseases and conditions complicating puerperium: Secondary | ICD-10-CM | POA: Insufficient documentation

## 2013-07-19 HISTORY — DX: Headache: R51

## 2013-07-19 LAB — URINALYSIS, ROUTINE W REFLEX MICROSCOPIC
Glucose, UA: NEGATIVE mg/dL
pH: 6 (ref 5.0–8.0)

## 2013-07-19 LAB — URINE MICROSCOPIC-ADD ON

## 2013-07-19 MED ORDER — DIPHENHYDRAMINE HCL 50 MG/ML IJ SOLN
25.0000 mg | Freq: Once | INTRAMUSCULAR | Status: AC
  Administered 2013-07-19: 19:00:00 via INTRAVENOUS
  Filled 2013-07-19: qty 1

## 2013-07-19 MED ORDER — DEXAMETHASONE SODIUM PHOSPHATE 10 MG/ML IJ SOLN
10.0000 mg | Freq: Once | INTRAMUSCULAR | Status: AC
  Administered 2013-07-19: 10 mg via INTRAVENOUS
  Filled 2013-07-19: qty 1

## 2013-07-19 MED ORDER — METOCLOPRAMIDE HCL 5 MG/ML IJ SOLN
10.0000 mg | Freq: Once | INTRAMUSCULAR | Status: AC
  Administered 2013-07-19: 10 mg via INTRAVENOUS
  Filled 2013-07-19: qty 2

## 2013-07-19 MED ORDER — LACTATED RINGERS IV BOLUS (SEPSIS)
1000.0000 mL | Freq: Once | INTRAVENOUS | Status: AC
  Administered 2013-07-19: 1000 mL via INTRAVENOUS

## 2013-07-19 NOTE — MAU Note (Signed)
C/o headache for past week that has progressively gotten worse- started off really mild and nagging but today is throbbing; headache is behind the left eye and and at the top of her head; c/o heaviness in both legs since the birth of the baby; delivered via c-section on 06-12-13;

## 2013-07-19 NOTE — MAU Provider Note (Signed)
History     CSN: [id number]  Arrival date and time: 07/19/13 1747   None     Chief Complaint  Patient presents with  . Headache  . Extremity Weakness   Headache  Associated symptoms include photophobia. Pertinent negatives include no abdominal pain, blurred vision, coughing, fever, nausea or vomiting.  Extremity Weakness  Pertinent negatives include no fever.   KIMARA BENCOMO is a 37 y.o. [phone] s/p c-section 5 weeks ago presents with 1 week of progressively worsening headaches that were non responsive to fioricet at home. Pt describes as sharp pain behind left eye. 8/10 ache. Pt states that she has mild phono and photo phobia but not severe. This headache is similar to her worse headaches prior to delivery. Pt has adverse reactions to imitrex and compazine.  Pt also reports that her legs have been heavy since delivery but has not ahd any falls and are not feeling weak.  OB History   Grav Para Term Preterm Abortions TAB SAB Ect Mult Living   4 4 4       4       Past Medical History  Diagnosis Date  . Diabetes mellitus without complication     gestational diabetes with current pregnancyt  . JYNWGNFA(213.0)     Past Surgical History  Procedure Laterality Date  . Cesarean section    . Tonsillectomy    . Cesarean section with bilateral tubal ligation Bilateral 06/12/2013    Procedure: REPEAT CESAREAN SECTION WITH BILATERAL TUBAL LIGATION;  Surgeon: Lesly Dukes, MD;  Location: [facility];  Service: Obstetrics;  Laterality: Bilateral;    Family History  Problem Relation Age of Onset  . Asthma Neg Hx   . Diabetes Neg Hx   . Cancer Neg Hx   . COPD Neg Hx   . Hearing loss Neg Hx   . Heart disease Neg Hx   . Hypertension Neg Hx   . Miscarriages / Stillbirths Neg Hx   . Stroke Neg Hx     History  Substance Use Topics  . Smoking status: Former Smoker -- 1.00 packs/day for 2 years    Types: Cigarettes    Quit date: 12/09/1994  . Smokeless tobacco: Never Used  .  Alcohol Use: No    Allergies:  Allergies  Allergen Reactions  . Toradol [Ketorolac Tromethamine]     Makes her sleepy  . Tramadol     Couldn't be awakened    Prescriptions prior to admission  Medication Sig Dispense Refill  . ACCU-CHEK SMARTVIEW test strip USE AS DIRECTED  1 each  1  . docusate sodium 100 MG CAPS Take 100 mg by mouth daily as needed.  30 capsule  0  . hydrocortisone-pramoxine (PROCTOFOAM HC) rectal foam Place 1 applicator rectally 2 (two) times daily.  10 g  0  . hydrocortisone-pramoxine (PROCTOFOAM-HC) rectal foam Place rectally 3 (three) times daily as needed.  10 g  0  . lanolin OINT Apply 1 application topically as needed (for breast care).  5 g  3  . oxyCODONE-acetaminophen (PERCOCET/ROXICET) 5-325 MG per tablet Take 1-2 tablets by mouth every 4 (four) hours as needed.  60 tablet  0  . pantoprazole (PROTONIX) 40 MG tablet Take 1 tablet (40 mg total) by mouth daily.  30 tablet  3  . Prenatal Vit-Fe Fumarate-FA (PRENATAL MULTIVITAMIN) TABS Take 1 tablet by mouth daily at 12 noon.  30 tablet  2  . witch hazel-glycerin (TUCKS) pad Apply 1 application topically as needed.  40 each  12    Review of Systems  Constitutional: Negative for fever and chills.  HENT: Negative for congestion.   Eyes: Positive for photophobia. Negative for blurred vision and double vision.       Occasionally seeing squiggles with headache   Respiratory: Negative for cough, hemoptysis, sputum production, shortness of breath and stridor.   Cardiovascular: Negative for chest pain, palpitations and orthopnea.  Gastrointestinal: Negative for heartburn, nausea, vomiting and abdominal pain.  Genitourinary: Negative for dysuria, urgency, frequency, hematuria and flank pain.  Musculoskeletal: Positive for extremity weakness. Negative for myalgias.  Neurological: Positive for headaches.   Physical Exam   Blood pressure 107/74, pulse 89, temperature 98.1 F (36.7 C), temperature source Oral,  resp. rate 18, last menstrual period 09/11/2012, unknown if currently breastfeeding.  Physical Exam  Nursing note and vitals reviewed. Constitutional: She is oriented to person, place, and time. She appears well-developed and well-nourished. No distress.  HENT:  Head: Normocephalic and atraumatic.  Right Ear: External ear normal.  Left Ear: External ear normal.  Eyes: Conjunctivae and EOM are normal. Right eye exhibits no discharge. Left eye exhibits no discharge. No scleral icterus.  Neck: Normal range of motion. Neck supple. No JVD present. No tracheal deviation present.  Respiratory: No stridor.  Musculoskeletal: Normal range of motion. She exhibits no edema and no tenderness.  Lymphadenopathy:    She has no cervical adenopathy.  Neurological: She is alert and oriented to person, place, and time. She has normal reflexes. She displays no atrophy, no tremor and normal reflexes. No cranial nerve deficit or sensory deficit. She exhibits normal muscle tone. She displays no seizure activity. Coordination normal. GCS eye subscore is 4. GCS verbal subscore is 5. GCS motor subscore is 6.  Skin: She is not diaphoretic.  Psychiatric: She has a normal mood and affect. Her behavior is normal. Judgment and thought content normal.    MAU Course  Procedures  MDM Pt with likely migraine now starting back that she is PP. Will give pt IV decadron, IV benadryl, IV reglan and LR Bolus. Reevaluate after tx.   Headache improved down to 4/10 asks to go home and try to sleep  Assessment and Plan  JAMACIA JESTER is a 37 y.o. [id number] with migraine improving after treatment. Will discharge with follow up on Monday.  Tawana Scale 07/19/2013, 6:51 PM

## 2013-07-23 ENCOUNTER — Ambulatory Visit: Payer: Medicaid Other | Admitting: Nurse Practitioner

## 2013-08-14 ENCOUNTER — Ambulatory Visit: Payer: Medicaid Other | Admitting: Internal Medicine

## 2013-08-16 ENCOUNTER — Encounter: Payer: Self-pay | Admitting: Internal Medicine

## 2013-08-28 NOTE — MAU Provider Note (Signed)
Attestation of Attending Supervision of Fellow: Evaluation and management procedures were performed by the Fellow under my supervision and collaboration.  I have reviewed the Fellow's note and chart, and I agree with the management and plan.    

## 2013-11-16 IMAGING — US US OB DETAIL+14 WK
1 series · 12 of 28 positions shown · non-contrast
Comparison: none

[Series 1: us ob detail +14 wk · 12 of 64 slices shown]
[im 3/64]
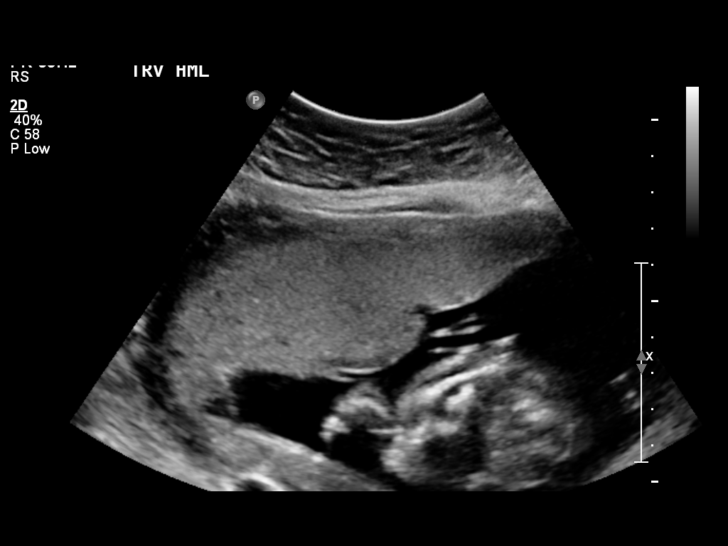
[im 8/64]
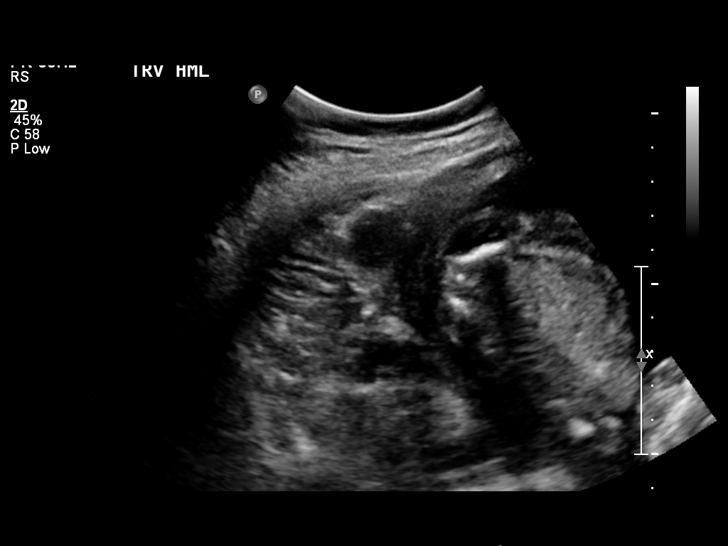
[im 12/64]
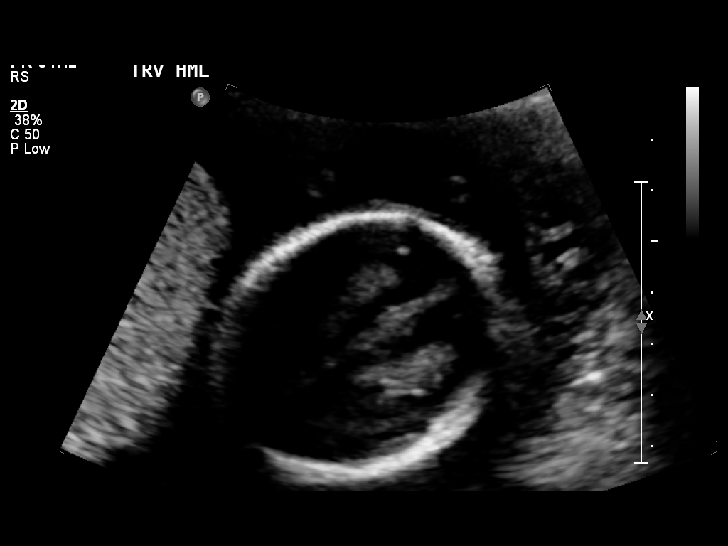
[im 19/64]
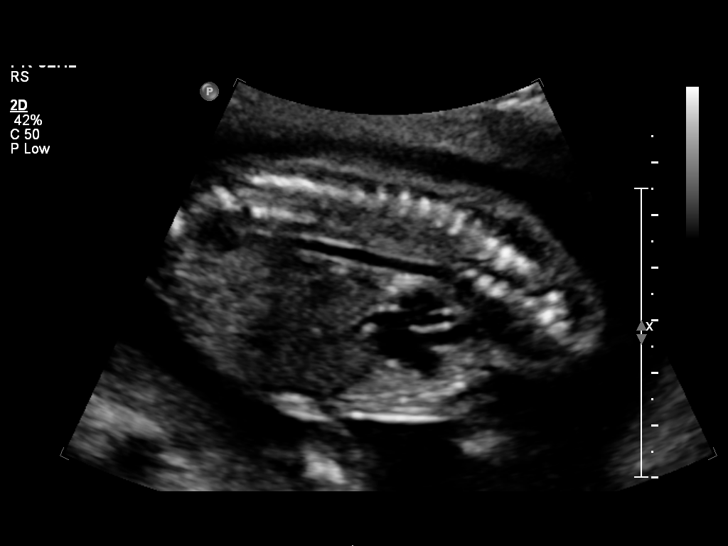
[im 24/64]
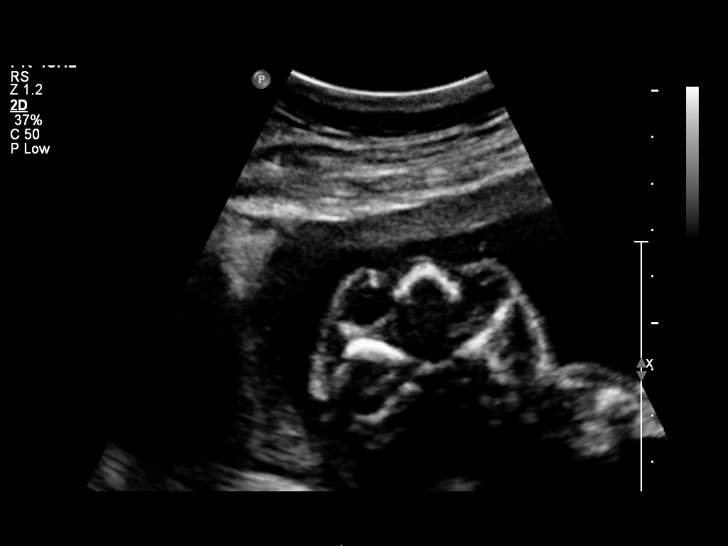
[im 29/64]
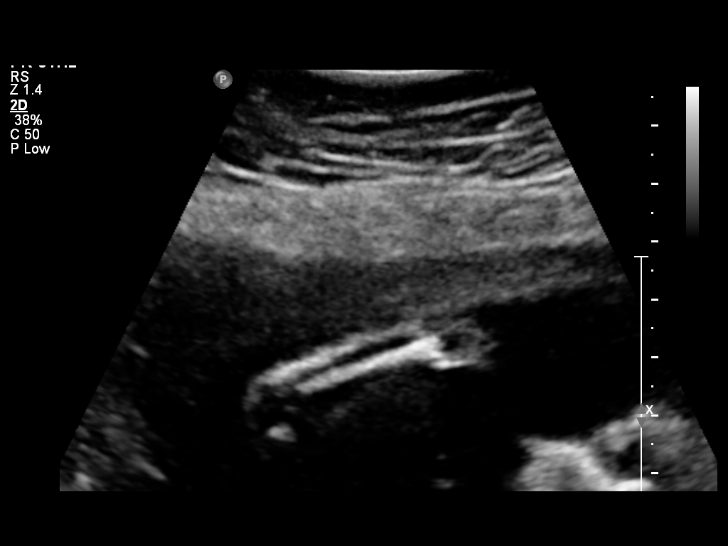
[im 36/64]
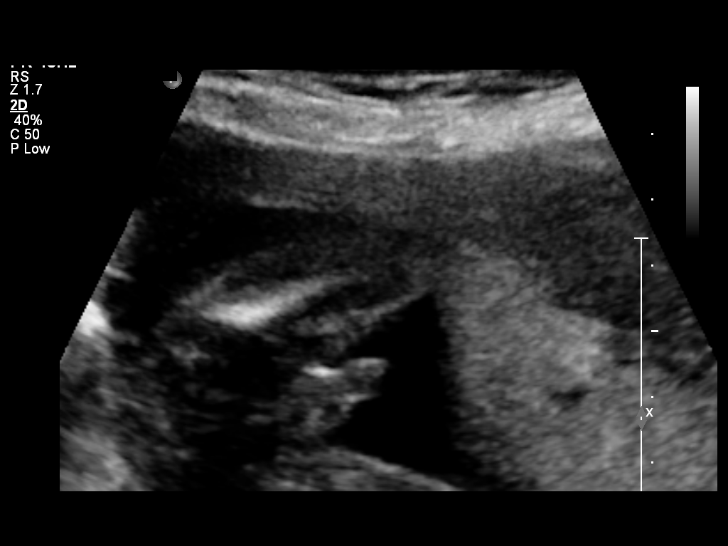
[im 40/64]
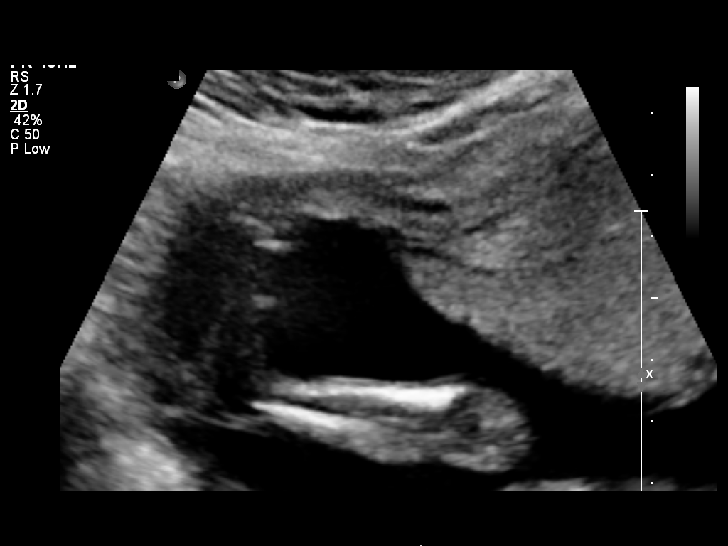
[im 45/64]
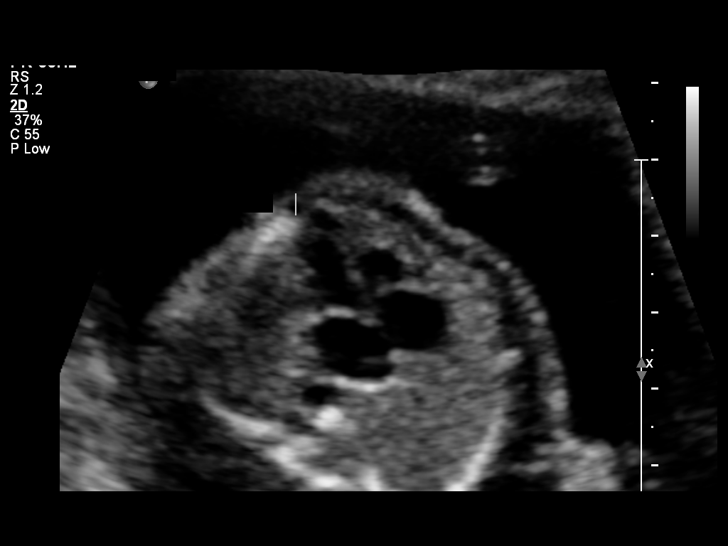
[im 52/64]
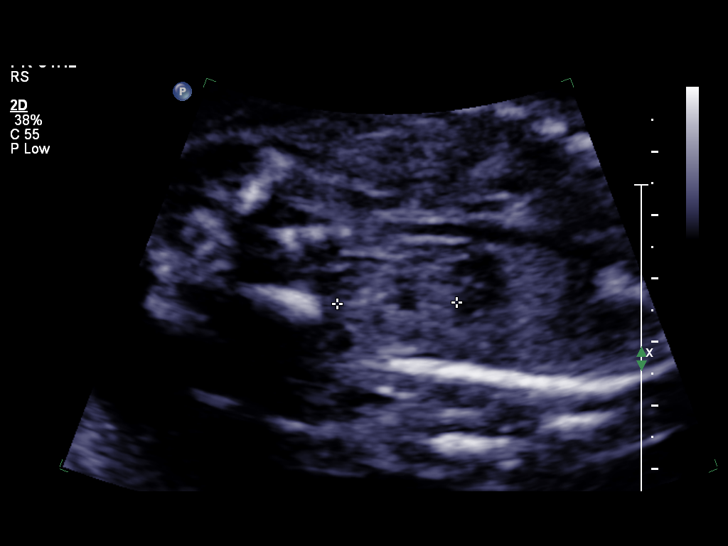
[im 57/64]
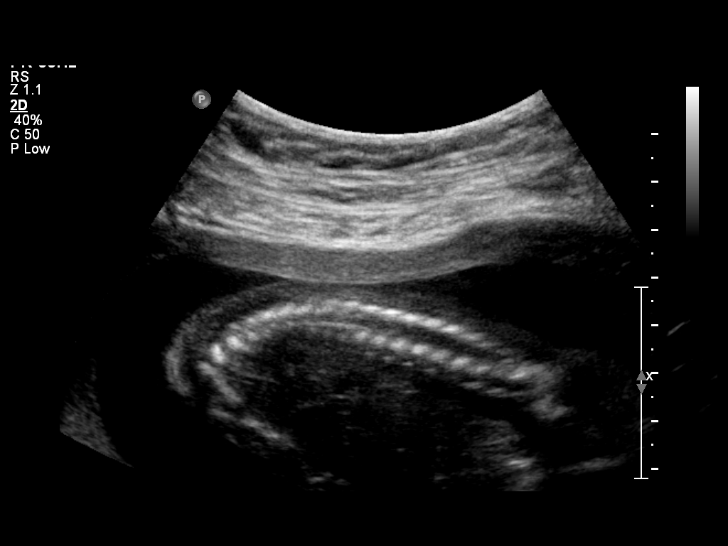
[im 61/64]
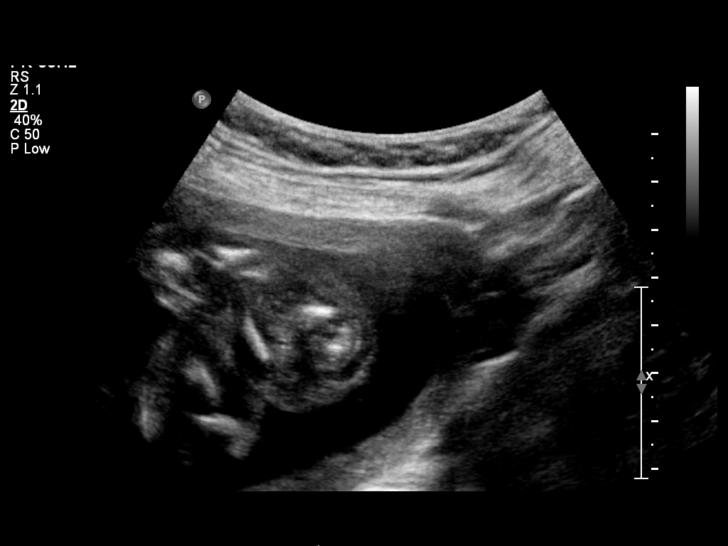

[12 of 28 positions shown; findings below may reference images not displayed]

OBSTETRICS REPORT
                      (Signed Final 01/26/2013 [DATE])

Service(s) Provided

 US OB DETAIL + 14 WK                                  76811.0
Indications

 Advanced maternal age (AMA), Multigravida
 History of cesarean delivery, currently pregnant      654.20,
 Detailed fetal anatomic survey
Fetal Evaluation

 Num Of Fetuses:    1
 Preg. Location:    Intrauterine
 Fetal Heart Rate:  153                         bpm
 Cardiac Activity:  Observed
 Presentation:      Variable
 Placenta:          Fundal, above cervical os
 P. Cord            Visualized, central
 Insertion:

 Amniotic Fluid
 AFI FV:      Subjectively within normal limits
                                             Larg Pckt:   5.98   cm
 RUQ:   5.98   cm
Biometry

 BPD:     46.8  mm    G. Age:   20w 1d                CI:        68.21   70 - 86
                                                      FL/HC:      17.8   15.9 -

 HC:     181.2  mm    G. Age:   20w 4d       34  %    HC/AC:      1.06   1.06 -

 AC:       171  mm    G. Age:   22w 1d       83  %    FL/BPD:
 FL:      32.2  mm    G. Age:   20w 0d       21  %    FL/AC:      18.8   20 - 24
 HUM:     29.5  mm    G. Age:   19w 5d       20  %
 CER:     19.7  mm    G. Age:   18w 6d      < 5  %
 NFT:     4.42  mm

 Est. FW:     396  gm    0 lb 14 oz      52  %
Gestational Age

 LMP:           18w 1d       Date:   09/21/12                 EDD:   06/28/13
 U/S Today:     20w 5d                                        EDD:   06/10/13
 Best:          20w 5d    Det. By:   U/S (01/26/13)           EDD:   06/10/13
2nd Trimester Genetic Sonogram - Trisomy 21 Screening

 Age:                                             37          Risk=1:   113

 Structural anomalies (inc. cardiac):             No
 Echogenic bowel:                                 No
 Hypoplastic / absent midphalanx 5th Digit:       No
 Wide space 4st-0nd toes:                         N/A
 Pyelectasis:                                     No
 2-vessel umbilical cord:                         No
 Echogenic cardiac foci:                          No
Anatomy

 Cranium:          Appears normal         Aortic Arch:      Appears normal
 Fetal Cavum:      Appears normal         Ductal Arch:      Appears normal
 Ventricles:       Appears normal         Diaphragm:        Appears normal
 Choroid Plexus:   Appears normal         Stomach:          Appears normal
 Cerebellum:       Appears normal         Abdomen:          Appears normal
 Posterior Fossa:  Appears normal         Abdominal Wall:   Appears nml (cord
                                                            insert, abd wall)
 Nuchal Fold:      Appears normal         Cord Vessels:     Appears normal (3
                                                            vessel cord)
 Face:             Appears normal         Kidneys:          Appear normal
                   (orbits and profile)
 Lips:             Appears normal         Bladder:          Appears normal
 Heart:            Appears normal         Spine:            Appears normal
                   (4CH, axis, and
                   situs)
 RVOT:             Appears normal         Lower             Appears normal
                                          Extremities:
 LVOT:             Appears normal         Upper             Appears normal
                                          Extremities:

 Other:  Fetus appears to be a male. Heels and 5th digit visualized. Nasal
         bone visualized.
Targeted Anatomy

 Fetal Central Nervous System
 Lat. Ventricles:  7.1                    Cisterna Magna:
Cervix Uterus Adnexa

 Cervical Length:   5.28      cm

 Cervix:       Normal appearance by transabdominal scan.
 Uterus:       No abnormality visualized.
 Cul De Sac:   No free fluid seen.

 Adnexa:     No abnormality visualized.
Impression

 Siup demonstrating an EGA by ultrasound of 20w 5d. This is
 2w 4d ahead of expected EGA by LMP of 18w 1d and
 suggests best dating by todays exam.

 No focal fetal or placental abnormalities are noted with a
 good anatomic evaluation possible. No soft markers for Down
 Syndrome are seen. Correlation with other aneuploidy
 screening results, if available, would be recommended for a
 more complete risk assessment.

 Subjectively and quantitatively normal amniotic fluid volume.
 Normal cervical length.

## 2014-03-18 ENCOUNTER — Ambulatory Visit: Payer: Medicaid Other | Admitting: Neurology

## 2014-04-29 ENCOUNTER — Encounter (HOSPITAL_COMMUNITY): Payer: Self-pay

## 2014-09-09 ENCOUNTER — Encounter (HOSPITAL_COMMUNITY): Payer: Self-pay

## 2016-05-05 ENCOUNTER — Emergency Department (HOSPITAL_COMMUNITY)
Admission: EM | Admit: 2016-05-05 | Discharge: 2016-05-05 | Disposition: A | Discharge status: Home / Self Care | Payer: 59 | Attending: Emergency Medicine | Admitting: Emergency Medicine

## 2016-05-05 ENCOUNTER — Encounter (HOSPITAL_COMMUNITY): Payer: Self-pay

## 2016-05-05 DIAGNOSIS — E119 Type 2 diabetes mellitus without complications: Secondary | ICD-10-CM | POA: Insufficient documentation

## 2016-05-05 DIAGNOSIS — G43909 Migraine, unspecified, not intractable, without status migrainosus: Secondary | ICD-10-CM | POA: Diagnosis not present

## 2016-05-05 DIAGNOSIS — Z79899 Other long term (current) drug therapy: Secondary | ICD-10-CM | POA: Diagnosis not present

## 2016-05-05 DIAGNOSIS — Z7982 Long term (current) use of aspirin: Secondary | ICD-10-CM | POA: Diagnosis not present

## 2016-05-05 DIAGNOSIS — Z87891 Personal history of nicotine dependence: Secondary | ICD-10-CM | POA: Insufficient documentation

## 2016-05-05 MED ORDER — DIPHENHYDRAMINE HCL 50 MG/ML IJ SOLN: 25.0000 mg | Freq: Once | INTRAMUSCULAR | Status: DC

## 2016-05-05 MED ORDER — DIPHENHYDRAMINE HCL 50 MG/ML IJ SOLN
25.0000 mg | Freq: Once | INTRAMUSCULAR | Status: AC
  Administered 2016-05-05: 25 mg via INTRAMUSCULAR
  Filled 2016-05-05: qty 1

## 2016-05-05 MED ORDER — METOCLOPRAMIDE HCL 5 MG/ML IJ SOLN: 10.0000 mg | Freq: Once | INTRAMUSCULAR | Status: DC

## 2016-05-05 MED ORDER — METOCLOPRAMIDE HCL 5 MG/ML IJ SOLN
10.0000 mg | Freq: Once | INTRAMUSCULAR | Status: AC
  Administered 2016-05-05: 10 mg via INTRAMUSCULAR
  Filled 2016-05-05: qty 2

## 2016-05-05 NOTE — ED Notes (Signed)
Patient complains of left sided headache for 1 week, nausea has resolved but reports that her left eye is still blurry. Alert and oriented, denies trauma

## 2016-05-05 NOTE — Discharge Instructions (Signed)

## 2016-05-05 NOTE — ED Notes (Signed)
Patient [name] is ready to leave, just came to get a CT scan which MD explained was not necessary for today's visit. Patient [name] being here makes her anxious and feels the medications given made her nauseated. Patient offered zofran but refused at this time. Patient in NAD.

## 2016-05-05 NOTE — ED Provider Notes (Signed)
CSN: [id number]     Arrival date & time 05/05/16  1153 History   First MD Initiated Contact with Patient 05/05/16 1545     No chief complaint on file.    (Consider location/radiation/quality/duration/timing/severity/associated sxs/prior Treatment) Patient is a 40 y.o. female presenting with headaches. The history is provided by the patient.  Headache Pain location:  L parietal Quality:  Sharp Radiates to:  Does not radiate Severity currently:  10/10 Severity at highest:  10/10 Onset quality:  Sudden Duration:  2 days Timing:  Constant Progression:  Unchanged Chronicity:  New Similar to prior headaches: no   Relieved by:  Nothing Worsened by:  Nothing Ineffective treatments:  None tried Associated symptoms: no congestion, no dizziness, no fever, no myalgias, no nausea and no vomiting    40 yo F With a chief complaint of a headache. This is left-sided sharp and shooting. Feels like her prior migraines. The patient is here because she had a larger than normal aura towards the end of her headache. She is still having a mild headache with photophobia. Denies any difficulty with speech are also coordination or difficulty walking.  Past Medical History  Diagnosis Date  . Diabetes mellitus without complication (HCC)     gestational diabetes with current pregnancyt  . WUJWJXBJ(478.2Headache(784.0)    Past Surgical History  Procedure Laterality Date  . Cesarean section    . Tonsillectomy    . Cesarean section with bilateral tubal ligation Bilateral 06/12/2013    Procedure: REPEAT CESAREAN SECTION WITH BILATERAL TUBAL LIGATION;  Surgeon: Lesly DukesKelly H Leggett, MD;  Location: [facility];  Service: Obstetrics;  Laterality: Bilateral;   Family History  Problem Relation Age of Onset  . Asthma Neg Hx   . Diabetes Neg Hx   . Cancer Neg Hx   . COPD Neg Hx   . Hearing loss Neg Hx   . Heart disease Neg Hx   . Hypertension Neg Hx   . Miscarriages / Stillbirths Neg Hx   . Stroke Neg Hx    Social History   Substance Use Topics  . Smoking status: Former Smoker -- 1.00 packs/day for 2 years    Types: Cigarettes    Quit date: 12/09/1994  . Smokeless tobacco: Never Used  . Alcohol Use: No   OB History    Gravida Para Term Preterm AB TAB SAB Ectopic Multiple Living   4 4 4       4      Review of Systems  Constitutional: Negative for fever and chills.  HENT: Negative for congestion and rhinorrhea.   Eyes: Negative for redness and visual disturbance.  Respiratory: Negative for shortness of breath and wheezing.   Cardiovascular: Negative for chest pain and palpitations.  Gastrointestinal: Negative for nausea and vomiting.  Genitourinary: Negative for dysuria and urgency.  Musculoskeletal: Negative for myalgias and arthralgias.  Skin: Negative for pallor and wound.  Neurological: Negative for dizziness and headaches.      Allergies  Compazine; Toradol; and Tramadol  Home Medications   Prior to Admission medications   Medication Sig Start Date End Date Taking? Authorizing Provider  ALPRAZolam Prudy Feeler(XANAX) 1 MG tablet Take 1 tablet by mouth 2 (two) times daily as needed. 10/16/15  Yes Historical Provider, MD  amphetamine-dextroamphetamine (ADDERALL) 20 MG tablet Take 20 mg by mouth 3 (three) times daily as needed. 04/10/16  Yes Historical Provider, MD  Aspirin-Salicylamide-Caffeine (BC HEADACHE POWDER PO) Take 1-2 packets by mouth daily as needed (for headaches).   Yes Historical Provider,  MD  medroxyPROGESTERone (DEPO-PROVERA) 150 MG/ML injection Inject 150 mg into the muscle every 3 (three) months. 12/15/15  Yes Historical Provider, MD  zolpidem (AMBIEN) 10 MG tablet Take 10 mg by mouth at bedtime as needed. 04/10/16  Yes Historical Provider, MD  Prenatal Vit-Fe Fumarate-FA (PRENATAL MULTIVITAMIN) TABS Take 1 tablet by mouth daily at 12 noon. Patient not taking: Reported on 05/05/2016 04/16/13   Adam Phenix, MD   BP 106/68 mmHg  Pulse 72  Temp(Src) 98.9 F (37.2 C) (Oral)  Resp 16  SpO2 97%   LMP 02/26/2016 Physical Exam  Constitutional: She is oriented to person, place, and time. She appears well-developed and well-nourished. No distress.  HENT:  Head: Normocephalic and atraumatic.  Eyes: EOM are normal. Pupils are equal, round, and reactive to light. Left eye exhibits no chemosis, no discharge and no exudate.  Neck: Normal range of motion. Neck supple.  Cardiovascular: Normal rate and regular rhythm.  Exam reveals no gallop and no friction rub.   No murmur heard. Pulmonary/Chest: Effort normal. She has no wheezes. She has no rales.  Abdominal: Soft. She exhibits no distension. There is no tenderness. There is no rebound and no guarding.  Musculoskeletal: She exhibits no edema or tenderness.  Neurological: She is alert and oriented to person, place, and time. She has normal strength. No cranial nerve deficit or sensory deficit. She displays a negative Romberg sign. Coordination and gait normal. GCS eye subscore is 4. GCS verbal subscore is 5. GCS motor subscore is 6. She displays no Babinski's sign on the right side. She displays no Babinski's sign on the left side.  Reflex Scores:      Tricep reflexes are 2+ on the right side and 2+ on the left side.      Bicep reflexes are 2+ on the right side and 2+ on the left side.      Brachioradialis reflexes are 2+ on the right side and 2+ on the left side.      Patellar reflexes are 2+ on the right side and 2+ on the left side.      Achilles reflexes are 2+ on the right side and 2+ on the left side. Benign neuro exam  Skin: Skin is warm and dry. She is not diaphoretic.  Psychiatric: She has a normal mood and affect. Her behavior is normal.  Nursing note and vitals reviewed.   ED Course  Procedures (including critical care time) Labs Review Labs Reviewed - No data to display  Imaging Review No results found. I have personally reviewed and evaluated these images and lab results as part of my medical decision-making.   EKG  Interpretation None      MDM   Final diagnoses:  Migraine without status migrainosus, not intractable, unspecified migraine type    40 yo F with a chief complaint of headache. Will treat with a headache cocktail.  Feeling better, d/c home.   11:31 PM:  I have discussed the diagnosis/risks/treatment options with the patient and believe the pt to be eligible for discharge home to follow-up with PCP. We also discussed returning to the ED immediately if new or worsening sx occur. We discussed the sx which are most concerning (e.g., stroke like symptoms) that necessitate immediate return. Medications administered to the patient during their visit and any new prescriptions provided to the patient are listed below.  Medications given during this visit Medications  diphenhydrAMINE (BENADRYL) injection 25 mg (25 mg Intramuscular Given 05/05/16 1603)  metoCLOPramide (REGLAN)  injection 10 mg (10 mg Intramuscular Given 05/05/16 1603)    Discharge Medication List as of 05/05/2016  5:10 PM      The patient appears reasonably screen and/or stabilized for discharge and I doubt any other medical condition or other [facility] requiring further screening, evaluation, or treatment in the ED at this time prior to discharge.    Melene Planan Santi Troung, DO 05/05/16 2331

## 2017-04-16 ENCOUNTER — Encounter (HOSPITAL_BASED_OUTPATIENT_CLINIC_OR_DEPARTMENT_OTHER): Payer: Self-pay | Admitting: Emergency Medicine

## 2017-04-16 ENCOUNTER — Emergency Department (HOSPITAL_BASED_OUTPATIENT_CLINIC_OR_DEPARTMENT_OTHER)
Admission: EM | Admit: 2017-04-16 | Discharge: 2017-04-16 | Disposition: A | Discharge status: Home / Self Care | Payer: 59 | Attending: Emergency Medicine | Admitting: Emergency Medicine

## 2017-04-16 ENCOUNTER — Emergency Department (HOSPITAL_BASED_OUTPATIENT_CLINIC_OR_DEPARTMENT_OTHER): Payer: 59

## 2017-04-16 DIAGNOSIS — Y93H2 Activity, gardening and landscaping: Secondary | ICD-10-CM | POA: Diagnosis not present

## 2017-04-16 DIAGNOSIS — Y92017 Garden or yard in single-family (private) house as the place of occurrence of the external cause: Secondary | ICD-10-CM | POA: Insufficient documentation

## 2017-04-16 DIAGNOSIS — S61215A Laceration without foreign body of left ring finger without damage to nail, initial encounter: Secondary | ICD-10-CM | POA: Insufficient documentation

## 2017-04-16 DIAGNOSIS — Z79899 Other long term (current) drug therapy: Secondary | ICD-10-CM | POA: Insufficient documentation

## 2017-04-16 DIAGNOSIS — E119 Type 2 diabetes mellitus without complications: Secondary | ICD-10-CM | POA: Insufficient documentation

## 2017-04-16 DIAGNOSIS — W293XXA Contact with powered garden and outdoor hand tools and machinery, initial encounter: Secondary | ICD-10-CM | POA: Insufficient documentation

## 2017-04-16 DIAGNOSIS — Y999 Unspecified external cause status: Secondary | ICD-10-CM | POA: Insufficient documentation

## 2017-04-16 DIAGNOSIS — S61315A Laceration without foreign body of left ring finger with damage to nail, initial encounter: Secondary | ICD-10-CM

## 2017-04-16 DIAGNOSIS — S6992XA Unspecified injury of left wrist, hand and finger(s), initial encounter: Secondary | ICD-10-CM | POA: Diagnosis present

## 2017-04-16 DIAGNOSIS — Z87891 Personal history of nicotine dependence: Secondary | ICD-10-CM | POA: Diagnosis not present

## 2017-04-16 NOTE — ED Notes (Signed)
ED Provider at bedside. 

## 2017-04-16 NOTE — Discharge Instructions (Signed)
Keep wound clean and dry. Keep finger elevated, ice several times a day. Bacitracin twice a day. Follow up as needed.

## 2017-04-16 NOTE — ED Notes (Signed)
Pt given d/c instructions as per chart. Verbalizes understanding. No questions. 

## 2017-04-16 NOTE — ED Triage Notes (Signed)
Laceration to L ring finger from garden clippers. Bleeding controlled.

## 2017-04-16 NOTE — ED Notes (Addendum)
Pt states she was trimming bushes and cut the tip of her left ring finger. Bleeding controlled. Moves slightly. Feels touch. Cap refill < 3 sec. Unsure of tetanus.

## 2017-04-16 NOTE — ED Provider Notes (Signed)
[facility] Provider Note   CSN: [id number] Arrival date & time: 04/16/17  1823   By signing my name below, I, [name], attest that this documentation has been prepared under the direction and in the presence of [name], [employer] CorporationPA-C Electronically Signed: Soijett Castro, ED Scribe. 04/16/17. 7:45 PM.  History   Chief Complaint Chief Complaint  Patient presents with  . Laceration    HPI [name] is a 41 y.o. female with a PMHx of DM, who presents to the Emergency Department complaining of laceration to left ring finger onset 2.5 hours ago. Pt reports associated left ring finger pain. Pt has tried crazy glue and applying pressure without medications with no relief of her symptoms. She notes that she was trimming her bushes with an electric garden clipper when she accidentally cut her left ring finger. Per pt chart review, her last tetanus vaccination was in 06/04/2013. Denies color change, swelling, and any other symptoms.    The history is provided by the patient. No language interpreter was used.    Past Medical History:  Diagnosis Date  . Diabetes mellitus without complication (HCC)    gestational diabetes with current pregnancyt  . WUJWJXBJ(478.2Headache(784.0)     Patient Active Problem List   Diagnosis Date Noted  . Thrombosed hemorrhoids 06/15/2013  . AMA (advanced maternal age) multigravida 35+ 05/23/2013  . Gestational diabetes mellitus in pregnancy 05/23/2013  . Sleep apnea, obstructive 04/30/2013  . Late prenatal care 02/26/2013  . Previous cesarean delivery, delivered, with or without mention of antepartum condition 02/26/2013  . PANIC DISORDER 06/27/2007  . MIGRAINES, HX OF 06/27/2007    Past Surgical History:  Procedure Laterality Date  . CESAREAN SECTION    . CESAREAN SECTION WITH BILATERAL TUBAL LIGATION Bilateral 06/12/2013   Procedure: REPEAT CESAREAN SECTION WITH BILATERAL TUBAL LIGATION;  Surgeon: Lesly DukesKelly H Leggett, MD;  Location: [facility];   Service: Obstetrics;  Laterality: Bilateral;  . TONSILLECTOMY      OB History    Gravida Para Term Preterm AB Living   4 4 4     4    SAB TAB Ectopic Multiple Live Births           4       Home Medications    Prior to Admission medications   Medication Sig Start Date End Date Taking? Authorizing Provider  ALPRAZolam Prudy Feeler(XANAX) 1 MG tablet Take 1 tablet by mouth 2 (two) times daily as needed. 10/16/15  Yes [provider]  amphetamine-dextroamphetamine (ADDERALL) 20 MG tablet Take 20 mg by mouth 3 (three) times daily as needed. 04/10/16  Yes [provider]  zolpidem (AMBIEN) 10 MG tablet Take 10 mg by mouth at bedtime as needed. 04/10/16  Yes [provider]  Aspirin-Salicylamide-Caffeine (BC HEADACHE POWDER PO) Take 1-2 packets by mouth daily as needed (for headaches).    [provider]  Prenatal Vit-Fe Fumarate-FA (PRENATAL MULTIVITAMIN) TABS Take 1 tablet by mouth daily at 12 noon. Patient not taking: Reported on 05/05/2016 04/16/13   Adam PhenixArnold, James G, MD    Family History Family History  Problem Relation Age of Onset  . Asthma Neg Hx   . Diabetes Neg Hx   . Cancer Neg Hx   . COPD Neg Hx   . Hearing loss Neg Hx   . Heart disease Neg Hx   . Hypertension Neg Hx   . Miscarriages / Stillbirths Neg Hx   . Stroke Neg Hx     Social History  Social History  Substance Use Topics  . Smoking status: Former Smoker    Packs/day: 1.00    Years: 2.00    Types: Cigarettes    Quit date: 12/09/1994  . Smokeless tobacco: Never Used  . Alcohol use No     Allergies   Compazine [prochlorperazine edisylate]; Toradol [ketorolac tromethamine]; and Tramadol   Review of Systems Review of Systems  Musculoskeletal: Positive for arthralgias (left ring finger). Negative for joint swelling.  Skin: Positive for wound (left ring finger). Negative for color change.     Physical Exam Updated Vital Signs BP 120/85 (BP Location: Left Arm)   Pulse (!) 108   Temp  98.5 F (36.9 C) (Oral)   Resp 17   LMP 04/14/2017   SpO2 100%   Physical Exam  Constitutional: She is oriented to person, place, and time. She appears well-developed and well-nourished. No distress.  HENT:  Head: Normocephalic and atraumatic.  Eyes: EOM are normal.  Neck: Neck supple.  Cardiovascular: Normal rate.   Pulmonary/Chest: Effort normal. No respiratory distress.  Abdominal: She exhibits no distension.  Musculoskeletal: Normal range of motion.  2 cm laceration to the tip of the left ring finger including just the distal fingernail. Mildly gaping. Hemostatic. All Refill distally adjacent to the laceration.  Neurological: She is alert and oriented to person, place, and time.  Skin: Skin is warm and dry.  Psychiatric: She has a normal mood and affect. Her behavior is normal.  Nursing note and vitals reviewed.    ED Treatments / Results  DIAGNOSTIC STUDIES: Oxygen Saturation is 100% on RA, nl by my interpretation.    COORDINATION OF CARE: 7:43 PM Discussed treatment plan with pt at bedside which includes left ringer finger xray, laceration repair, and pt agreed to plan.   Labs (all labs ordered are listed, but only abnormal results are displayed) Labs Reviewed - No data to display  EKG  EKG Interpretation None       Radiology Dg Finger Ring Left  Result Date: 04/16/2017 CLINICAL DATA:  Cut tip of left ring finger with hedge clippers EXAM: LEFT RING FINGER 2+V COMPARISON:  None. FINDINGS: No fracture or dislocation is seen. The joint spaces are preserved. Mild soft tissue swelling overlying the distal phalanx. No radiopaque foreign body is seen. IMPRESSION: Mild soft tissue swelling overlying the distal phalanx. No fracture, dislocation, or radiopaque foreign body is seen. Electronically Signed   By: Charline Bills M.D.   On: 04/16/2017 20:04    Procedures .Marland KitchenLaceration Repair Date/Time: 04/16/2017 8:35 PM Performed by: Jaynie Crumble Authorized by:  Jaynie Crumble   Consent:    Consent obtained:  Verbal   Consent given by:  Patient   Risks discussed:  Pain and need for additional repair Anesthesia (see MAR for exact dosages):    Anesthesia method:  None Laceration details:    Location:  Finger   Finger location:  L ring finger   Length (cm):  2 Repair type:    Repair type:  Simple Pre-procedure details:    Preparation:  Imaging obtained to evaluate for foreign bodies and patient was prepped and draped in usual sterile fashion Exploration:    Hemostasis achieved with:  Direct pressure   Wound exploration: wound explored through full range of motion     Wound extent: no foreign bodies/material noted     Contaminated: no   Treatment:    Area cleansed with:  Saline   Amount of cleaning:  Standard   Irrigation solution:  Sterile saline   Irrigation method:  Syringe   Visualized foreign bodies/material removed: no   Skin repair:    Repair method:  Steri-Strips   Number of Steri-Strips:  3 Approximation:    Approximation:  Close Post-procedure details:    Dressing:  Sterile dressing   Patient tolerance of procedure:  Tolerated well, no immediate complications    (including critical care time)  Medications Ordered in ED Medications - No data to display   Initial Impression / Assessment and Plan / ED Course  I have reviewed the triage vital signs and the nursing notes.  Pertinent labs & imaging results that were available during my care of the patient were reviewed by me and considered in my medical decision making (see chart for details).     Patient in emergency department laceration to the fingertip of the left ring finger. Only distal tip of the nail involved. I clipped the nail down. Nail bed appears to be intact. X-ray is negative. Wound is gaping, advised patient that I would like to put some stitches to fix the wound, however patient refused. Discussed with Dr. Dalene Seltzer who has seen patient as well, we  will repair with Steri-Strips. The wound was thoroughly soaked and cleaned with iodine saline. Patient will follow-up as an as needed. Tetanus is up-to-date  Vitals:   04/16/17 1830 04/16/17 2110 04/16/17 2118  BP: 120/85 116/79 112/85  Pulse: (!) 108 80 84  Resp: 17 18 18   Temp: 98.5 F (36.9 C)  98.6 F (37 C)  TempSrc: Oral  Oral  SpO2: 100% 100% 100%    Final Clinical Impressions(s) / ED Diagnoses   Final diagnoses:  Laceration of left ring finger without foreign body with damage to nail, initial encounter    New Prescriptions Discharge Medication List as of 04/16/2017  8:57 PM     I personally performed the services described in this documentation, which was scribed in my presence. The recorded information has been reviewed and is accurate.    Jaynie Crumble, PA-C 04/17/17 0008    Alvira Monday, MD 04/18/17 [phone]
